# Patient Record
Sex: Male | Born: 1940
Health system: Southern US, Community
[De-identification: ages and names within clinical notes are randomized; demographics above are authoritative.]

## PROBLEM LIST (undated history)

## (undated) DIAGNOSIS — J449 Chronic obstructive pulmonary disease, unspecified: Secondary | ICD-10-CM

## (undated) DIAGNOSIS — G039 Meningitis, unspecified: Secondary | ICD-10-CM

## (undated) DIAGNOSIS — H9190 Unspecified hearing loss, unspecified ear: Secondary | ICD-10-CM

## (undated) DIAGNOSIS — J189 Pneumonia, unspecified organism: Secondary | ICD-10-CM

## (undated) DIAGNOSIS — N429 Disorder of prostate, unspecified: Secondary | ICD-10-CM

## (undated) DIAGNOSIS — M1990 Unspecified osteoarthritis, unspecified site: Secondary | ICD-10-CM

## (undated) HISTORY — PX: NO PAST SURGERIES: SHX2092

## (undated) HISTORY — PX: FRACTURE SURGERY: SHX138

---

## 2014-06-28 ENCOUNTER — Encounter (HOSPITAL_BASED_OUTPATIENT_CLINIC_OR_DEPARTMENT_OTHER): Payer: Self-pay | Admitting: Emergency Medicine

## 2014-06-28 ENCOUNTER — Emergency Department (HOSPITAL_BASED_OUTPATIENT_CLINIC_OR_DEPARTMENT_OTHER): Payer: Medicare Other

## 2014-06-28 ENCOUNTER — Inpatient Hospital Stay (HOSPITAL_BASED_OUTPATIENT_CLINIC_OR_DEPARTMENT_OTHER)
Admission: EM | Admit: 2014-06-28 | Discharge: 2014-06-30 | DRG: 871 | Disposition: A | Discharge status: Home / Self Care | Payer: Medicare Other | Attending: Internal Medicine | Admitting: Internal Medicine

## 2014-06-28 DIAGNOSIS — N429 Disorder of prostate, unspecified: Secondary | ICD-10-CM | POA: Diagnosis present

## 2014-06-28 DIAGNOSIS — A419 Sepsis, unspecified organism: Secondary | ICD-10-CM | POA: Diagnosis present

## 2014-06-28 DIAGNOSIS — J441 Chronic obstructive pulmonary disease with (acute) exacerbation: Secondary | ICD-10-CM | POA: Diagnosis present

## 2014-06-28 DIAGNOSIS — H9190 Unspecified hearing loss, unspecified ear: Secondary | ICD-10-CM | POA: Diagnosis present

## 2014-06-28 DIAGNOSIS — Z79899 Other long term (current) drug therapy: Secondary | ICD-10-CM

## 2014-06-28 DIAGNOSIS — Z7982 Long term (current) use of aspirin: Secondary | ICD-10-CM

## 2014-06-28 DIAGNOSIS — R05 Cough: Secondary | ICD-10-CM

## 2014-06-28 DIAGNOSIS — J189 Pneumonia, unspecified organism: Secondary | ICD-10-CM | POA: Diagnosis present

## 2014-06-28 DIAGNOSIS — R059 Cough, unspecified: Secondary | ICD-10-CM

## 2014-06-28 DIAGNOSIS — J9601 Acute respiratory failure with hypoxia: Secondary | ICD-10-CM | POA: Diagnosis present

## 2014-06-28 DIAGNOSIS — R0902 Hypoxemia: Secondary | ICD-10-CM

## 2014-06-28 DIAGNOSIS — Z87891 Personal history of nicotine dependence: Secondary | ICD-10-CM

## 2014-06-28 HISTORY — DX: Disorder of prostate, unspecified: N42.9

## 2014-06-28 HISTORY — DX: Meningitis, unspecified: G03.9

## 2014-06-28 HISTORY — DX: Unspecified hearing loss, unspecified ear: H91.90

## 2014-06-28 LAB — URINE MICROSCOPIC-ADD ON

## 2014-06-28 LAB — CBC WITH DIFFERENTIAL/PLATELET
BASOS ABS: 0 10*3/uL (ref 0.0–0.1)
BASOS PCT: 0 % (ref 0–1)
Eosinophils Absolute: 0.2 10*3/uL (ref 0.0–0.7)
Eosinophils Relative: 2 % (ref 0–5)
HCT: 41.6 % (ref 39.0–52.0)
Hemoglobin: 13.9 g/dL (ref 13.0–17.0)
Lymphocytes Relative: 10 % — ABNORMAL LOW (ref 12–46)
Lymphs Abs: 1 10*3/uL (ref 0.7–4.0)
MCH: 30.1 pg (ref 26.0–34.0)
MCHC: 33.4 g/dL (ref 30.0–36.0)
MCV: 90 fL (ref 78.0–100.0)
Monocytes Absolute: 1 10*3/uL (ref 0.1–1.0)
Monocytes Relative: 10 % (ref 3–12)
Neutro Abs: 7.4 10*3/uL (ref 1.7–7.7)
Neutrophils Relative %: 78 % — ABNORMAL HIGH (ref 43–77)
Platelets: 183 10*3/uL (ref 150–400)
RBC: 4.62 MIL/uL (ref 4.22–5.81)
RDW: 13.3 % (ref 11.5–15.5)
WBC: 9.6 10*3/uL (ref 4.0–10.5)

## 2014-06-28 LAB — BASIC METABOLIC PANEL
Anion gap: 15 (ref 5–15)
BUN: 12 mg/dL (ref 6–23)
CALCIUM: 9.2 mg/dL (ref 8.4–10.5)
CO2: 27 mEq/L (ref 19–32)
Chloride: 98 mEq/L (ref 96–112)
Creatinine, Ser: 1.1 mg/dL (ref 0.50–1.35)
GFR calc Af Amer: 76 mL/min — ABNORMAL LOW (ref >90)
GFR calc non Af Amer: 66 mL/min — ABNORMAL LOW (ref >90)
GLUCOSE: 161 mg/dL — AB (ref 70–99)
Potassium: 3.8 mEq/L (ref 3.7–5.3)
SODIUM: 140 meq/L (ref 137–147)

## 2014-06-28 LAB — URINALYSIS, ROUTINE W REFLEX MICROSCOPIC
BILIRUBIN URINE: NEGATIVE
Glucose, UA: NEGATIVE mg/dL
Ketones, ur: NEGATIVE mg/dL
Leukocytes, UA: NEGATIVE
Nitrite: NEGATIVE
PH: 5.5 (ref 5.0–8.0)
Protein, ur: 30 mg/dL — AB
SPECIFIC GRAVITY, URINE: 1.027 (ref 1.005–1.030)
UROBILINOGEN UA: 0.2 mg/dL (ref 0.0–1.0)

## 2014-06-28 LAB — EXPECTORATED SPUTUM ASSESSMENT W REFEX TO RESP CULTURE

## 2014-06-28 LAB — EXPECTORATED SPUTUM ASSESSMENT W GRAM STAIN, RFLX TO RESP C

## 2014-06-28 LAB — PRO B NATRIURETIC PEPTIDE: Pro B Natriuretic peptide (BNP): 44.2 pg/mL (ref 0–125)

## 2014-06-28 LAB — I-STAT CG4 LACTIC ACID, ED: Lactic Acid, Venous: 0.87 mmol/L (ref 0.5–2.2)

## 2014-06-28 LAB — MRSA PCR SCREENING: MRSA BY PCR: NEGATIVE

## 2014-06-28 LAB — TROPONIN I

## 2014-06-28 LAB — STREP PNEUMONIAE URINARY ANTIGEN: Strep Pneumo Urinary Antigen: NEGATIVE

## 2014-06-28 LAB — HIV ANTIBODY (ROUTINE TESTING W REFLEX): HIV 1&2 Ab, 4th Generation: NONREACTIVE

## 2014-06-28 MED ORDER — INFLUENZA VAC SPLIT QUAD 0.5 ML IM SUSY
0.5000 mL | PREFILLED_SYRINGE | INTRAMUSCULAR | Status: AC
  Administered 2014-06-29: 0.5 mL via INTRAMUSCULAR
  Filled 2014-06-28: qty 0.5

## 2014-06-28 MED ORDER — VANCOMYCIN HCL IN DEXTROSE 1-5 GM/200ML-% IV SOLN
1000.0000 mg | Freq: Once | INTRAVENOUS | Status: AC
  Administered 2014-06-28: 1000 mg via INTRAVENOUS
  Filled 2014-06-28: qty 200

## 2014-06-28 MED ORDER — SODIUM CHLORIDE 0.9 % IV BOLUS (SEPSIS)
500.0000 mL | Freq: Once | INTRAVENOUS | Status: AC
  Administered 2014-06-28: 500 mL via INTRAVENOUS

## 2014-06-28 MED ORDER — IPRATROPIUM-ALBUTEROL 0.5-2.5 (3) MG/3ML IN SOLN
3.0000 mL | Freq: Four times a day (QID) | RESPIRATORY_TRACT | Status: DC
  Administered 2014-06-28 – 2014-06-29 (×4): 3 mL via RESPIRATORY_TRACT
  Filled 2014-06-28 (×4): qty 3

## 2014-06-28 MED ORDER — SODIUM CHLORIDE 0.9 % IV SOLN
INTRAVENOUS | Status: DC
  Administered 2014-06-28: 125 mL/h via INTRAVENOUS
  Administered 2014-06-28: 10 mL/h via INTRAVENOUS

## 2014-06-28 MED ORDER — PIPERACILLIN-TAZOBACTAM 3.375 G IVPB 30 MIN
3.3750 g | Freq: Once | INTRAVENOUS | Status: AC
  Administered 2014-06-28: 3.375 g via INTRAVENOUS
  Filled 2014-06-28 (×2): qty 50

## 2014-06-28 MED ORDER — ACETAMINOPHEN 500 MG PO TABS
1000.0000 mg | ORAL_TABLET | Freq: Once | ORAL | Status: AC
  Administered 2014-06-28: 1000 mg via ORAL
  Filled 2014-06-28: qty 2

## 2014-06-28 MED ORDER — DEXTROSE 5 % IV SOLN
1.0000 g | INTRAVENOUS | Status: DC
  Administered 2014-06-28 – 2014-06-30 (×3): 1 g via INTRAVENOUS
  Filled 2014-06-28 (×4): qty 10

## 2014-06-28 MED ORDER — ALBUTEROL SULFATE (2.5 MG/3ML) 0.083% IN NEBU
2.5000 mg | INHALATION_SOLUTION | RESPIRATORY_TRACT | Status: DC | PRN
  Filled 2014-06-28: qty 3

## 2014-06-28 MED ORDER — ACETAMINOPHEN 325 MG PO TABS
650.0000 mg | ORAL_TABLET | Freq: Four times a day (QID) | ORAL | Status: DC | PRN
  Administered 2014-06-28: 650 mg via ORAL
  Filled 2014-06-28: qty 2

## 2014-06-28 MED ORDER — GUAIFENESIN-DM 100-10 MG/5ML PO SYRP
5.0000 mL | ORAL_SOLUTION | ORAL | Status: DC | PRN
  Administered 2014-06-28 – 2014-06-30 (×2): 5 mL via ORAL
  Filled 2014-06-28 (×2): qty 5

## 2014-06-28 MED ORDER — ASPIRIN 81 MG PO CHEW
81.0000 mg | CHEWABLE_TABLET | Freq: Every day | ORAL | Status: DC
  Administered 2014-06-28 – 2014-06-30 (×3): 81 mg via ORAL
  Filled 2014-06-28 (×3): qty 1

## 2014-06-28 MED ORDER — HEPARIN SODIUM (PORCINE) 5000 UNIT/ML IJ SOLN
5000.0000 [IU] | Freq: Three times a day (TID) | INTRAMUSCULAR | Status: DC
  Administered 2014-06-28 – 2014-06-30 (×7): 5000 [IU] via SUBCUTANEOUS
  Filled 2014-06-28 (×10): qty 1

## 2014-06-28 MED ORDER — ALBUTEROL SULFATE (2.5 MG/3ML) 0.083% IN NEBU
5.0000 mg | INHALATION_SOLUTION | Freq: Once | RESPIRATORY_TRACT | Status: AC
  Administered 2014-06-28: 5 mg via RESPIRATORY_TRACT
  Filled 2014-06-28: qty 6

## 2014-06-28 MED ORDER — IPRATROPIUM BROMIDE 0.02 % IN SOLN
0.5000 mg | Freq: Once | RESPIRATORY_TRACT | Status: AC
  Administered 2014-06-28: 0.5 mg via RESPIRATORY_TRACT
  Filled 2014-06-28: qty 2.5

## 2014-06-28 MED ORDER — KETOROLAC TROMETHAMINE 30 MG/ML IJ SOLN
30.0000 mg | Freq: Once | INTRAMUSCULAR | Status: AC
  Administered 2014-06-28: 30 mg via INTRAVENOUS
  Filled 2014-06-28: qty 1

## 2014-06-28 MED ORDER — AZITHROMYCIN 500 MG IV SOLR
500.0000 mg | INTRAVENOUS | Status: DC
  Administered 2014-06-28 – 2014-06-30 (×3): 500 mg via INTRAVENOUS
  Filled 2014-06-28 (×4): qty 500

## 2014-06-28 MED ORDER — TRAMADOL HCL 50 MG PO TABS
50.0000 mg | ORAL_TABLET | Freq: Four times a day (QID) | ORAL | Status: DC | PRN
  Administered 2014-06-28: 50 mg via ORAL
  Filled 2014-06-28: qty 1

## 2014-06-28 MED ORDER — TAMSULOSIN HCL 0.4 MG PO CAPS
0.8000 mg | ORAL_CAPSULE | Freq: Every day | ORAL | Status: DC
  Administered 2014-06-28: 0.8 mg via ORAL
  Filled 2014-06-28 (×3): qty 2

## 2014-06-28 MED ORDER — METHYLPREDNISOLONE SODIUM SUCC 40 MG IJ SOLR
40.0000 mg | Freq: Three times a day (TID) | INTRAMUSCULAR | Status: DC
  Administered 2014-06-28 – 2014-06-29 (×4): 40 mg via INTRAVENOUS
  Filled 2014-06-28 (×7): qty 1

## 2014-06-28 MED ORDER — GUAIFENESIN ER 600 MG PO TB12
600.0000 mg | ORAL_TABLET | Freq: Two times a day (BID) | ORAL | Status: DC
  Administered 2014-06-28 – 2014-06-29 (×3): 600 mg via ORAL
  Filled 2014-06-28 (×4): qty 1

## 2014-06-28 NOTE — Progress Notes (Signed)
PATIENT DETAILS Name: Jeffrey [name] Age: 73 y.o. Sex: male Date of Birth: May 11, 1941 Admit Date: 06/28/2014 Admitting Physician Ron ParkerHarvette [name] Jenkins, MD ZOX:[id number]:KALISH, Nolon BussingMICHAEL J, MD  Brief Summary: Jeffrey [name] is a 73 y.o. male ex-smoker presented to the ED with 1 week hx of cough, subjective fever, and SOB. In the ED he was noted to be tachycardic to the 120s which improved with IVF, and hypoxic to 88 on room air which improved with O2 via [place].CXR suggestive of PNA.  Subjective: Feels better  Assessment/Plan: Principal Problem:   CAP (community acquired pneumonia):Clinically improved, still febrile, no leukocytosis. Continue with IV Abx Active Problems:   Sepsis:secondary to above. Follow blood cultures. Continue IV Abx   Acute Hypoxic Resp Failure:secondary to CAP and likely underlying undiagnosed COPD   Suspected COPD exacerbation:40 year hx of smoking, quit 15 years back, wheezing with prolonged expiration. Add steroids, scheduled nebs. Currently comfortable,will need outpatient PFT's  Disposition: Remain inpatient  Antibiotics:  IV Rocephin 10/29>>  IV Zithromax 10/29>>  DVT Prophylaxis: Prophylactic Heparin   Code Status: Full code   Family Communication Son at bedside  Procedures:  None  CONSULTS:  None  Time spent 40 minutes-which includes 50% of the time with face-to-face with patient/ family and coordinating care related to the above assessment and plan.  MEDICATIONS: Scheduled Meds: . aspirin  81 mg Oral Daily  . azithromycin  500 mg Intravenous Q24H  . cefTRIAXone (ROCEPHIN)  IV  1 g Intravenous Q24H  . guaiFENesin  600 mg Oral BID  . heparin  5,000 Units Subcutaneous 3 times per day  . [START ON 06/29/2014] Influenza vac split quadrivalent PF  0.5 mL Intramuscular Tomorrow-1000  . ipratropium-albuterol  3 mL Nebulization Q6H  . methylPREDNISolone (SOLU-MEDROL) injection  40 mg Intravenous 3 times per day  . tamsulosin  0.8 mg Oral QPC  breakfast   Continuous Infusions: . sodium chloride 125 mL/hr (06/28/14 0859)   PRN Meds:.acetaminophen, albuterol, guaiFENesin-dextromethorphan  Antibiotics: Anti-infectives   Start     Dose/Rate Route Frequency Ordered Stop   06/28/14 0800  azithromycin (ZITHROMAX) 500 mg in dextrose 5 % 250 mL IVPB     500 mg 250 mL/hr over 60 Minutes Intravenous Every 24 hours 06/28/14 0557 07/05/14 0759   06/28/14 0600  cefTRIAXone (ROCEPHIN) 1 g in dextrose 5 % 50 mL IVPB     1 g 100 mL/hr over 30 Minutes Intravenous Every 24 hours 06/28/14 0557 07/05/14 0559   06/28/14 0300  vancomycin (VANCOCIN) IVPB 1000 mg/200 mL premix     1,000 mg 200 mL/hr over 60 Minutes Intravenous  Once 06/28/14 0245 06/28/14 0540   06/28/14 0300  piperacillin-tazobactam (ZOSYN) IVPB 3.375 g     3.375 g 100 mL/hr over 30 Minutes Intravenous  Once 06/28/14 0245 06/28/14 0335       PHYSICAL EXAM: Vital signs in last 24 hours: Filed Vitals:   06/28/14 0700 06/28/14 0740 06/28/14 1135 06/28/14 1200  BP:  125/68 137/75   Pulse:  80 91   Temp: 98.2 F (36.8 [name])   101.3 F (38.5 [name])  TempSrc: Oral   Tympanic  Resp:  14 18   Height:      Weight:      SpO2:  92% 96%     Weight change:  Filed Weights   06/28/14 0311 06/28/14 0530  Weight: 108.863 kg (240 lb) 112.7 kg (248 lb 7.3 oz)   Body mass index is  35.65 kg/(m^2).   Gen Exam: Awake and alert with clear speech.  Not in any resp distress Neck: Supple, No JVD.   Chest: Good air entry, prolonged exp, rhonchi all over CVS: S1 S2 Regular, no murmurs.  Abdomen: soft, BS +, non tender, non distended.  Extremities: no edema, lower extremities warm to touch Neurologic: Non Focal.   Skin: No Rash.   Wounds: N/A.   Intake/Output from previous day:  Intake/Output Summary (Last 24 hours) at 06/28/14 1227 Last data filed at 06/28/14 0600  Gross per 24 hour  Intake  302.5 ml  Output    125 ml  Net  177.5 ml     LAB RESULTS: CBC  Recent Labs Lab  06/28/14 0240  WBC 9.6  HGB 13.9  HCT 41.6  PLT 183  MCV 90.0  MCH 30.1  MCHC 33.4  RDW 13.3  LYMPHSABS 1.0  MONOABS 1.0  EOSABS 0.2  BASOSABS 0.0    Chemistries   Recent Labs Lab 06/28/14 0240  NA 140  K 3.8  CL 98  CO2 27  GLUCOSE 161*  BUN 12  CREATININE 1.10  CALCIUM 9.2    CBG: No results found for this basename: GLUCAP,  in the last 168 hours  GFR Estimated Creatinine Clearance: 77.5 ml/min (by [name]-G formula based on Cr of 1.1).  Coagulation profile No results found for this basename: INR, PROTIME,  in the last 168 hours  Cardiac Enzymes  Recent Labs Lab 06/28/14 0240  TROPONINI <0.30    No components found with this basename: POCBNP,  No results found for this basename: DDIMER,  in the last 72 hours No results found for this basename: HGBA1C,  in the last 72 hours No results found for this basename: CHOL, HDL, LDLCALC, TRIG, CHOLHDL, LDLDIRECT,  in the last 72 hours No results found for this basename: TSH, T4TOTAL, FREET3, T3FREE, THYROIDAB,  in the last 72 hours No results found for this basename: VITAMINB12, FOLATE, FERRITIN, TIBC, IRON, RETICCTPCT,  in the last 72 hours No results found for this basename: LIPASE, AMYLASE,  in the last 72 hours  Urine Studies No results found for this basename: UACOL, UAPR, USPG, UPH, UTP, UGL, UKET, UBIL, UHGB, UNIT, UROB, ULEU, UEPI, UWBC, URBC, UBAC, CAST, CRYS, UCOM, BILUA,  in the last 72 hours  MICROBIOLOGY: Recent Results (from the past 240 hour(s))  MRSA PCR SCREENING     Status: None   Collection Time    06/28/14  6:24 AM      Result Value Ref Range Status   MRSA by PCR NEGATIVE  NEGATIVE Final   Comment:            The GeneXpert MRSA Assay (FDA     approved for NASAL specimens     only), is one component of a     comprehensive MRSA colonization     surveillance program. It is not     intended to diagnose MRSA     infection nor to guide or     monitor treatment for     MRSA infections.     RADIOLOGY STUDIES/RESULTS: Dg Chest 2 View  06/28/2014   CLINICAL DATA:  Cough and fever.  Shortness of breath.  EXAM: CHEST  2 VIEW  COMPARISON:  None.  FINDINGS: There is an amorphous opacity in the left mid chest, most consistent with pneumonia given the clinical circumstances. Diffuse interstitial coarsening. No edema, effusion, or pneumothorax. Normal heart size. Mild aortic tortuosity.  IMPRESSION: Left-sided  pneumonia. Recommend followup radiography 6 weeks after treatment to ensure clearing.   Electronically Signed   By: Tiburcio PeaJonathan  Watts M.D.   On: 06/28/2014 03:27    Jeoffrey MassedGHIMIRE,SHANKER, MD  [facility] Pager:[phone]  If 7PM-7AM, please contact night-coverage [email or web address] Password [email or web address] 06/28/2014, 12:27 PM   LOS: 0 days

## 2014-06-28 NOTE — ED Provider Notes (Signed)
CSN: [id number]     Arrival date & time 06/28/14  0219 History   First MD Initiated Contact with Patient 06/28/14 [id number]     Chief Complaint  Patient presents with  . Shortness of Breath     (Consider location/radiation/quality/duration/timing/severity/associated sxs/prior Treatment) Patient is a 73 y.o. male presenting with shortness of breath. The history is provided by the patient.  Shortness of Breath Severity:  Severe Onset quality:  Gradual Duration:  1 week Timing:  Constant Progression:  Worsening Chronicity:  New Context: URI   Relieved by:  Nothing Worsened by:  Nothing tried Ineffective treatments:  None tried Associated symptoms: fever, sputum production and wheezing   Wheezing:    Severity:  Moderate   Onset quality:  Gradual   Timing:  Constant   Progression:  Worsening   Chronicity:  New Risk factors: no hx of PE/DVT     Past Medical History  Diagnosis Date  . Hard of hearing   . Prostate disorder   . Meningitis    History reviewed. No pertinent past surgical history. History reviewed. No pertinent family history. History  Substance Use Topics  . Smoking status: Former Games developermoker  . Smokeless tobacco: Not on file  . Alcohol Use: No    Review of Systems  Constitutional: Positive for fever.  Respiratory: Positive for sputum production, shortness of breath and wheezing.   All other systems reviewed and are negative.     Allergies  Review of patient's allergies indicates no known allergies.  Home Medications   Prior to Admission medications   Medication Sig Start Date End Date Taking? Authorizing Provider  aspirin 81 MG tablet Take 81 mg by mouth daily.   Yes Historical Provider, MD  Red Yeast Rice 600 MG CAPS Take by mouth.   Yes Historical Provider, MD  tamsulosin (FLOMAX) 0.4 MG CAPS capsule Take 0.8 mg by mouth.   Yes Historical Provider, MD   BP 142/89  Pulse 120  Temp(Src) 100.5 F (38.1 C) (Oral)  Resp 30  SpO2 91% Physical Exam   Constitutional: He is oriented to person, place, and time. He appears well-developed and well-nourished.  HENT:  Head: Normocephalic and atraumatic.  Mouth/Throat: Oropharynx is clear and moist.  Eyes: Conjunctivae are normal. Pupils are equal, round, and reactive to light.  Neck: Normal range of motion. Neck supple. No tracheal deviation present.  Cardiovascular: Regular rhythm and intact distal pulses.  Tachycardia present.   Pulmonary/Chest: He has wheezes. He has rhonchi. He has rales.  Abdominal: Soft. Bowel sounds are normal. There is no tenderness. There is no rebound and no guarding.  Musculoskeletal: Normal range of motion. He exhibits no edema.  Neurological: He is alert and oriented to person, place, and time. He has normal reflexes.  Skin: Skin is warm and dry. He is not diaphoretic.  Psychiatric: He has a normal mood and affect.    ED Course  Procedures (including critical care time) Labs Review Labs Reviewed  CULTURE, BLOOD (ROUTINE X 2)  CULTURE, BLOOD (ROUTINE X 2)  CBC WITH DIFFERENTIAL  BASIC METABOLIC PANEL  TROPONIN I  PRO B NATRIURETIC PEPTIDE  I-STAT CG4 LACTIC ACID, ED    Imaging Review No results found.   EKG Interpretation   Date/Time:  Thursday June 28 2014 02:41:41 EDT Ventricular Rate:  113 PR Interval:  152 QRS Duration: 96 QT Interval:  320 QTC Calculation: 438 R Axis:   78 Text Interpretation:  Sinus tachycardia Confirmed by [facility]  MD,  Mylo Choi ([id number])  on 06/28/2014 2:42:05 AM      MDM   Final diagnoses:  Cough    MDM Reviewed: nursing note and vitals Interpretation: labs, ECG and x-ray (pneumonia by me no elevated WBC count negative troponin) Consults: admitting MD   Medications  vancomycin (VANCOCIN) IVPB 1000 mg/200 mL premix (1,000 mg Intravenous New Bag/Given 06/28/14 0305)  0.9 %  sodium chloride infusion (not administered)  piperacillin-tazobactam (ZOSYN) IVPB 3.375 g (0 g Intravenous Stopped 06/28/14 0335)   albuterol (PROVENTIL) (2.5 MG/3ML) 0.083% nebulizer solution 5 mg (5 mg Nebulization Given 06/28/14 0315)  ipratropium (ATROVENT) nebulizer solution 0.5 mg (0.5 mg Nebulization Given 06/28/14 0315)  ketorolac (TORADOL) 30 MG/ML injection 30 mg (30 mg Intravenous Given 06/28/14 0301)  sodium chloride 0.9 % bolus 500 mL (500 mLs Intravenous New Bag/Given 06/28/14 0341)  acetaminophen (TYLENOL) tablet 1,000 mg (1,000 mg Oral Given 06/28/14 0340)   Admit   Erich Kochan Smitty CordsK Kollin Udell-Rasch, MD 06/28/14 [phone]

## 2014-06-28 NOTE — H&P (Signed)
[facility] History and Physical  Jeffrey FrayRichard C Baugh ZOX:[id number] DOB: 12/09/1940 DOA: 06/28/2014  Referring physician: EDP PCP: Sid FalconKALISH, MICHAEL J, MD   Chief Complaint: SOB   HPI: Jeffrey [name] is a 73 y.o. male who [facility] with 1 week history of not feeling well with URI symptoms.  He developed cough over the past few days and severe SOB onset tonight which prompted him to come to the ED.  Symptoms are associated with fever (objective to 100.8 in the ED).  Cough is productive.  There is associated wheezing.  In the ED he was noted to be tachycardic to the 120s which improved with IVF, and hypoxic to 88 on room air which improved with O2 via [place].  He has no history of COPD, and is not on home oxygen.  Review of Systems: Systems reviewed.  As above, otherwise negative  Past Medical History  Diagnosis Date  . Hard of hearing   . Prostate disorder   . Meningitis     2001   History reviewed. No pertinent past surgical history. Social History:  reports that he has quit smoking. His smoking use included Cigarettes. He has a 66 pack-year smoking history. He has never used smokeless tobacco. He reports that he does not drink alcohol or use illicit drugs.  No Known Allergies  History reviewed. No pertinent family history.   Prior to Admission medications   Medication Sig Start Date End Date Taking? Authorizing Provider  aspirin 81 MG tablet Take 81 mg by mouth daily.   Yes Historical Provider, MD  Red Yeast Rice 600 MG CAPS Take by mouth.   Yes Historical Provider, MD  tamsulosin (FLOMAX) 0.4 MG CAPS capsule Take 0.8 mg by mouth.   Yes Historical Provider, MD   Physical Exam: Filed Vitals:   06/28/14 0530  BP: 128/68  Pulse: 95  Temp: 98.3 F (36.8 C)  Resp: 16    BP 128/68  Pulse 95  Temp(Src) 98.3 F (36.8 C) (Oral)  Resp 16  Ht 5\' 10"  (1.778 m)  Wt 112.7 kg (248 lb 7.3 oz)  BMI 35.65 kg/m2  SpO2 93%  General Appearance:    Alert, oriented, no  distress, appears stated age  Head:    Normocephalic, atraumatic  Eyes:    PERRL, EOMI, sclera non-icteric        Nose:   Nares without drainage or epistaxis. Mucosa, turbinates normal  Throat:   Moist mucous membranes. Oropharynx without erythema or exudate.  Neck:   Supple. No carotid bruits.  No thyromegaly.  No lymphadenopathy.   Back:     No CVA tenderness, no spinal tenderness  Lungs:     Coarse breath sounds on left.  Chest wall:    No tenderness to palpitation  Heart:    Regular rate and rhythm without murmurs, gallops, rubs  Abdomen:     Soft, non-tender, nondistended, normal bowel sounds, no organomegaly  Genitalia:    deferred  Rectal:    deferred  Extremities:   No clubbing, cyanosis or edema.  Pulses:   2+ and symmetric all extremities  Skin:   Skin color, texture, turgor normal, no rashes or lesions  Lymph nodes:   Cervical, supraclavicular, and axillary nodes normal  Neurologic:   CNII-XII intact. Normal strength, sensation and reflexes      throughout    Labs on Admission:  Basic Metabolic Panel:  Recent Labs Lab 06/28/14 0240  NA 140  K 3.8  CL 98  CO2 27  GLUCOSE 161*  BUN 12  CREATININE 1.10  CALCIUM 9.2   Liver Function Tests: No results found for this basename: AST, ALT, ALKPHOS, BILITOT, PROT, ALBUMIN,  in the last 168 hours No results found for this basename: LIPASE, AMYLASE,  in the last 168 hours No results found for this basename: AMMONIA,  in the last 168 hours CBC:  Recent Labs Lab 06/28/14 0240  WBC 9.6  NEUTROABS 7.4  HGB 13.9  HCT 41.6  MCV 90.0  PLT 183   Cardiac Enzymes:  Recent Labs Lab 06/28/14 0240  TROPONINI <0.30    BNP (last 3 results)  Recent Labs  06/28/14 0240  PROBNP 44.2   CBG: No results found for this basename: GLUCAP,  in the last 168 hours  Radiological Exams on Admission: Dg Chest 2 View  06/28/2014   CLINICAL DATA:  Cough and fever.  Shortness of breath.  EXAM: CHEST  2 VIEW  COMPARISON:  None.   FINDINGS: There is an amorphous opacity in the left mid chest, most consistent with pneumonia given the clinical circumstances. Diffuse interstitial coarsening. No edema, effusion, or pneumothorax. Normal heart size. Mild aortic tortuosity.  IMPRESSION: Left-sided pneumonia. Recommend followup radiography 6 weeks after treatment to ensure clearing.   Electronically Signed   By: Tiburcio PeaJonathan  Watts M.D.   On: 06/28/2014 03:27    EKG: Independently reviewed.  Assessment/Plan Principal Problem:   CAP (community acquired pneumonia) Active Problems:   Sepsis   Hypoxia   1. CAP and resulting mild sepsis - patient with left sided PNA as demonstrated on CXR 1. Tylenol prn fever 2. Got zosyn and vanc in ED, will switch him to rocephin and azithromycin for CAP coverage 3. Tele monitor for tachycardia which has now resolved.  Suspect tachycardia was related to fever 4. IVF 2. Hypoxia - improved with O2 via [place], continuous pulse ox, continue to wean O2 as able, home when no longer requiring O2.   Code Status: Full Code  Family Communication: No family in room Disposition Plan: Admit to inpatient   Time spent: 70 min  Isaiah Torok M. [facility] Pager [phone]  If 7AM-7PM, please contact the day team taking care of the patient [email or web address] Password [email or web address] 06/28/2014, 5:57 AM

## 2014-06-28 NOTE — Progress Notes (Signed)
Utilization review completed.  

## 2014-06-28 NOTE — ED Notes (Signed)
"  Fighting a cold" x1 week.  Productive cough.  Tonight he states he is having difficulty breathing.

## 2014-06-28 NOTE — ED Notes (Signed)
Patient transported to X-ray 

## 2014-06-29 DIAGNOSIS — A419 Sepsis, unspecified organism: Secondary | ICD-10-CM | POA: Diagnosis not present

## 2014-06-29 LAB — LEGIONELLA ANTIGEN, URINE

## 2014-06-29 MED ORDER — PREDNISONE 10 MG PO TABS
60.0000 mg | ORAL_TABLET | Freq: Every day | ORAL | Status: DC
  Administered 2014-06-30: 60 mg via ORAL
  Filled 2014-06-29 (×2): qty 1

## 2014-06-29 MED ORDER — GUAIFENESIN ER 600 MG PO TB12
1200.0000 mg | ORAL_TABLET | Freq: Two times a day (BID) | ORAL | Status: DC
  Administered 2014-06-29 – 2014-06-30 (×2): 1200 mg via ORAL
  Filled 2014-06-29 (×3): qty 2

## 2014-06-29 MED ORDER — TAMSULOSIN HCL 0.4 MG PO CAPS
0.8000 mg | ORAL_CAPSULE | Freq: Every day | ORAL | Status: DC
  Administered 2014-06-29: 0.8 mg via ORAL
  Filled 2014-06-29 (×2): qty 2

## 2014-06-29 MED ORDER — IPRATROPIUM-ALBUTEROL 0.5-2.5 (3) MG/3ML IN SOLN
3.0000 mL | RESPIRATORY_TRACT | Status: DC | PRN
  Administered 2014-06-29: 3 mL via RESPIRATORY_TRACT
  Filled 2014-06-29: qty 3

## 2014-06-29 NOTE — Progress Notes (Signed)
PATIENT DETAILS Name: Jeffrey [name] Age: 73 y.o. Sex: male Date of Birth: 18-Sep-1940 Admit Date: 06/28/2014 Admitting Physician Ron Parker, MD ZOX:[id number], Nolon Bussing, MD  Brief Summary: Jeffrey [name] is a 73 y.o. male ex-smoker presented to the ED with 1 week hx of cough, subjective fever, and SOB. In the ED he was noted to be tachycardic to the 120s which improved with IVF, and hypoxic to 88 on room air which improved with O2 via [place].CXR suggestive of PNA.  Subjective: Seen with son at bedside, feels much better, denies fever and significant shortness of breath.  Assessment/Plan:  Principal Problem:   CAP (community acquired pneumonia) Active Problems:   Sepsis   Hypoxia   CAP (community acquired pneumonia): Presented with subjective fever, shortness of breath and hypoxia of 88%. Last fever of 101.3 on 10/29 at 11 AM, no fever since then. Patient is on Rocephin and azithromycin, continue IV antibiotics as well as mucolytics, bronchodilators and antitussives.  Sepsis:secondary to above. Follow blood cultures. Continue IV Abx  Acute Hypoxic Resp Failure: Presented with oxygen saturation of 88% on room air, improved on supplemental oxygen via [place]  Secondary to CAP and likely underlying undiagnosed COPD  Suspected COPD exacerbation: 40 year hx of smoking, quit 15 years back, wheezing with prolonged expiration. Add steroids, scheduled nebs.  Currently comfortable he will likely benefit from outpatient PFT's  Disposition: Remain inpatient  Antibiotics:  IV Rocephin 10/29>>  IV Zithromax 10/29>>  DVT Prophylaxis: Prophylactic Heparin   Code Status: Full code   Family Communication Son at bedside  Procedures:  None  CONSULTS:  None  Time spent 40 minutes-which includes 50% of the time with face-to-face with patient/ family and coordinating care related to the above assessment and plan.  MEDICATIONS: Scheduled Meds: . aspirin  81 mg Oral  Daily  . azithromycin  500 mg Intravenous Q24H  . cefTRIAXone (ROCEPHIN)  IV  1 g Intravenous Q24H  . guaiFENesin  600 mg Oral BID  . heparin  5,000 Units Subcutaneous 3 times per day  . Influenza vac split quadrivalent PF  0.5 mL Intramuscular Tomorrow-1000  . methylPREDNISolone (SOLU-MEDROL) injection  40 mg Intravenous 3 times per day  . tamsulosin  0.8 mg Oral QPC supper   Continuous Infusions: . sodium chloride 10 mL/hr at 06/28/14 2000   PRN Meds:.acetaminophen, guaiFENesin-dextromethorphan, ipratropium-albuterol, traMADol  Antibiotics: Anti-infectives   Start     Dose/Rate Route Frequency Ordered Stop   06/28/14 0800  azithromycin (ZITHROMAX) 500 mg in dextrose 5 % 250 mL IVPB     500 mg 250 mL/hr over 60 Minutes Intravenous Every 24 hours 06/28/14 0557 07/05/14 0759   06/28/14 0600  cefTRIAXone (ROCEPHIN) 1 g in dextrose 5 % 50 mL IVPB     1 g 100 mL/hr over 30 Minutes Intravenous Every 24 hours 06/28/14 0557 07/05/14 0559   06/28/14 0300  vancomycin (VANCOCIN) IVPB 1000 mg/200 mL premix     1,000 mg 200 mL/hr over 60 Minutes Intravenous  Once 06/28/14 0245 06/28/14 0540   06/28/14 0300  piperacillin-tazobactam (ZOSYN) IVPB 3.375 g     3.375 g 100 mL/hr over 30 Minutes Intravenous  Once 06/28/14 0245 06/28/14 0335       PHYSICAL EXAM: Vital signs in last 24 hours: Filed Vitals:   06/28/14 2046 06/28/14 2250 06/29/14 0100 06/29/14 0638  BP:  130/66  129/76  Pulse:  93 94 91  Temp:  97.6 F (36.4  C)  98 F (36.7 C)  TempSrc:  Oral  Oral  Resp:  19 18 18   Height:  5\' 10"  (1.778 m)    Weight:  113.9 kg (251 lb 1.7 oz)    SpO2: 93% 92% 96% 95%    Weight change: 5.037 kg (11 lb 1.7 oz) Filed Weights   06/28/14 0311 06/28/14 0530 06/28/14 2250  Weight: 108.863 kg (240 lb) 112.7 kg (248 lb 7.3 oz) 113.9 kg (251 lb 1.7 oz)   Body mass index is 36.03 kg/(m^2).   Gen Exam: Awake and alert with clear speech.  Not in any resp distress Neck: Supple, No JVD.     Chest: Good air entry, prolonged exp, rhonchi all over CVS: S1 S2 Regular, no murmurs.  Abdomen: soft, BS +, non tender, non distended.  Extremities: no edema, lower extremities warm to touch Neurologic: Non Focal.   Skin: No Rash.   Wounds: N/A.   Intake/Output from previous day:  Intake/Output Summary (Last 24 hours) at 06/29/14 1305 Last data filed at 06/29/14 0900  Gross per 24 hour  Intake 1805.84 ml  Output   1450 ml  Net 355.84 ml     LAB RESULTS: CBC  Recent Labs Lab 06/28/14 0240  WBC 9.6  HGB 13.9  HCT 41.6  PLT 183  MCV 90.0  MCH 30.1  MCHC 33.4  RDW 13.3  LYMPHSABS 1.0  MONOABS 1.0  EOSABS 0.2  BASOSABS 0.0    Chemistries   Recent Labs Lab 06/28/14 0240  NA 140  K 3.8  CL 98  CO2 27  GLUCOSE 161*  BUN 12  CREATININE 1.10  CALCIUM 9.2    CBG: No results found for this basename: GLUCAP,  in the last 168 hours  GFR Estimated Creatinine Clearance: 77.9 ml/min (by C-G formula based on Cr of 1.1).  Coagulation profile No results found for this basename: INR, PROTIME,  in the last 168 hours  Cardiac Enzymes  Recent Labs Lab 06/28/14 0240  TROPONINI <0.30    No components found with this basename: POCBNP,  No results found for this basename: DDIMER,  in the last 72 hours No results found for this basename: HGBA1C,  in the last 72 hours No results found for this basename: CHOL, HDL, LDLCALC, TRIG, CHOLHDL, LDLDIRECT,  in the last 72 hours No results found for this basename: TSH, T4TOTAL, FREET3, T3FREE, THYROIDAB,  in the last 72 hours No results found for this basename: VITAMINB12, FOLATE, FERRITIN, TIBC, IRON, RETICCTPCT,  in the last 72 hours No results found for this basename: LIPASE, AMYLASE,  in the last 72 hours  Urine Studies No results found for this basename: UACOL, UAPR, USPG, UPH, UTP, UGL, UKET, UBIL, UHGB, UNIT, UROB, ULEU, UEPI, UWBC, URBC, UBAC, CAST, CRYS, UCOM, BILUA,  in the last 72 hours  MICROBIOLOGY: Recent  Results (from the past 240 hour(s))  CULTURE, BLOOD (ROUTINE X 2)     Status: None   Collection Time    06/28/14  2:40 AM      Result Value Ref Range Status   Specimen Description BLOOD LEFT ANTECUBITAL   Final   Special Requests     Final   Value: BOTTLES DRAWN AEROBIC AND ANAEROBIC AER 6cc ANA 5cc   Culture  Setup Time     Final   Value: 06/28/2014 10:39     Performed at [facility]   Culture     Final   Value:  BLOOD CULTURE RECEIVED NO GROWTH TO DATE CULTURE WILL BE HELD FOR 5 DAYS BEFORE ISSUING A FINAL NEGATIVE REPORT     [facility]   Report Status PENDING   Incomplete  CULTURE, BLOOD (ROUTINE X 2)     Status: None   Collection Time    06/28/14  2:45 AM      Result Value Ref Range Status   Specimen Description BLOOD RIGHT ANTECUBITAL   Final   Special Requests     Final   Value: BOTTLES DRAWN AEROBIC AND ANAEROBIC AER 5cc ANA 5cc   Culture  Setup Time     Final   Value: 06/28/2014 10:38     Performed at [facility]   Culture     Final   Value:        BLOOD CULTURE RECEIVED NO GROWTH TO DATE CULTURE WILL BE HELD FOR 5 DAYS BEFORE ISSUING A FINAL NEGATIVE REPORT     Performed at [facility]   Report Status PENDING   Incomplete  MRSA PCR SCREENING     Status: None   Collection Time    06/28/14  6:24 AM      Result Value Ref Range Status   MRSA by PCR NEGATIVE  NEGATIVE Final   Comment:            The GeneXpert MRSA Assay (FDA     approved for NASAL specimens     only), is one component of a     comprehensive MRSA colonization     surveillance program. It is not     intended to diagnose MRSA     infection nor to guide or     monitor treatment for     MRSA infections.  CULTURE, EXPECTORATED SPUTUM-ASSESSMENT     Status: None   Collection Time    06/28/14  3:43 PM      Result Value Ref Range Status   Specimen Description SPU   Final   Special Requests NONE   Final   Sputum evaluation     Final   Value: THIS  SPECIMEN IS ACCEPTABLE. RESPIRATORY CULTURE REPORT TO FOLLOW.   Report Status 06/28/2014 FINAL   Final  CULTURE, RESPIRATORY (NON-EXPECTORATED)     Status: None   Collection Time    06/28/14  3:43 PM      Result Value Ref Range Status   Specimen Description SPUTUM   Final   Special Requests NONE   Final   Gram Stain     Final   Value: ABUNDANT WBC PRESENT,BOTH PMN AND MONONUCLEAR     RARE SQUAMOUS EPITHELIAL CELLS PRESENT     RARE GRAM POSITIVE COCCI     IN PAIRS IN CLUSTERS     Performed at [facility]   Culture     Final   Value: NO GROWTH     Performed at [facility]   Report Status PENDING   Incomplete    RADIOLOGY STUDIES/RESULTS: Dg Chest 2 View  06/28/2014   CLINICAL DATA:  Cough and fever.  Shortness of breath.  EXAM: CHEST  2 VIEW  COMPARISON:  None.  FINDINGS: There is an amorphous opacity in the left mid chest, most consistent with pneumonia given the clinical circumstances. Diffuse interstitial coarsening. No edema, effusion, or pneumothorax. Normal heart size. Mild aortic tortuosity.  IMPRESSION: Left-sided pneumonia. Recommend followup radiography 6 weeks after treatment to ensure clearing.   Electronically Signed   By: Christiane HaJonathan  Watts M.D.   On: 06/28/2014 03:27    Damaso Laday A, MD  [facility] Pager:[phone]  If 7PM-7AM, please contact night-coverage [email or web address] Password [email or web address] 06/29/2014, 1:05 PM   LOS: 1 day

## 2014-06-29 NOTE — Care Management Note (Signed)
    Page 1 of 1   07/02/2014     8:08:48 AM CARE MANAGEMENT NOTE 07/02/2014  Patient:  Hulan FrayBARE,Carnel C   Account Number:  [id number]  Date Initiated:  06/29/2014  Documentation initiated by:  Letha CapeAYLOR,Kaislee Chao  Subjective/Objective Assessment:   dx sob, pna  admit- lives alone.     Action/Plan:   Anticipated DC Date:  06/30/2014   Anticipated DC Plan:  HOME/SELF CARE      DC Planning Services  CM consult      Choice offered to / List presented to:             Status of service:  Completed, signed off Medicare Important Message given?  NA - LOS <3 / Initial given by admissions (If response is "NO", the following Medicare IM given date fields will be blank) Date Medicare IM given:   Medicare IM given by:   Date Additional Medicare IM given:   Additional Medicare IM given by:    Discharge Disposition:  HOME/SELF CARE  Per UR Regulation:  Reviewed for med. necessity/level of care/duration of stay  If discussed at Long Length of Stay Meetings, dates discussed:    Comments:  06/29/14 1516 Letha Capeeborah Shakeena Kafer RN, BSN [phone] patient lives alone, NCM will continue to follow for dc needs.

## 2014-06-30 MED ORDER — PREDNISONE (PAK) 10 MG PO TABS: ORAL_TABLET | ORAL | Status: DC

## 2014-06-30 MED ORDER — HYDROCODONE-ACETAMINOPHEN 5-325 MG PO TABS: 1.0000 | ORAL_TABLET | Freq: Four times a day (QID) | ORAL | Status: DC | PRN

## 2014-06-30 MED ORDER — LEVOFLOXACIN 750 MG PO TABS: 750.0000 mg | ORAL_TABLET | Freq: Every day | ORAL | Status: DC

## 2014-06-30 MED ORDER — GUAIFENESIN ER 600 MG PO TB12: 1200.0000 mg | ORAL_TABLET | Freq: Two times a day (BID) | ORAL | Status: DC

## 2014-06-30 NOTE — Discharge Summary (Signed)
Physician Discharge Summary  Jeffrey [name] ZOX:[id number] DOB: 09-19-1940 DOA: 06/28/2014  PCP: Sid FalconKALISH, MICHAEL J, MD  Admit date: 06/28/2014 Discharge date: 06/30/2014  Time spent: 40 minutes  Recommendations for Outpatient Follow-up:  1. Follow-up with primary care physician within one week  Discharge Diagnoses:  Principal Problem:   CAP (community acquired pneumonia) Active Problems:   Sepsis   Hypoxia   Discharge Condition: Stable  Diet recommendation: Regular  Filed Weights   06/28/14 0311 06/28/14 0530 06/28/14 2250  Weight: 108.863 kg (240 lb) 112.7 kg (248 lb 7.3 oz) 113.9 kg (251 lb 1.7 oz)    History of present illness:  Jeffrey [name] is a 73 y.o. male who [facility] with 1 week history of not feeling well with URI symptoms. He developed cough over the past few days and severe SOB onset tonight which prompted him to come to the ED. Symptoms are associated with fever (objective to 100.8 in the ED). Cough is productive. There is associated wheezing.  In the ED he was noted to be tachycardic to the 120s which improved with IVF, and hypoxic to 88 on room air which improved with O2 via [place]. He has no history of COPD, and is not on home oxygen.  Hospital Course:   CAP (community acquired pneumonia):  Presented with subjective fever, shortness of breath and hypoxia of 88%.  Last fever of 101.3 on 10/29 at 11 AM, no fever since then.  Patient started initially on Rocephin and azithromycin, mucolytics, bronchodilators and antitussives.  Discharge on 5 more days of levofloxacin 750, Mucinex twice a day. Given prescription for Norco as he mentioned moderate to severe pleuritic chest pain.  Sepsis: Present at admission with fever of 101.3, heart rate of 120 and presence of pneumonia. Follow blood cultures. Continue IV Abx. This is resolved before discharge.  Acute Hypoxic Resp Failure:  Presented with oxygen saturation of 88% on room air, improved on  supplemental oxygen via [place]  Secondary to CAP and likely underlying undiagnosed COPD   Suspected COPD exacerbation:  40 year hx of smoking, quit 15 years back, wheezing with prolonged. Discharged on tapered dose of steroid. Patient follow-up with PCP to determine if PFTs needed as outpatient after he recovers from concurrent illness.   Procedures:  None  Consultations:  None  Discharge Exam: Filed Vitals:   06/30/14 0516  BP: 132/75  Pulse: 81  Temp: 98 F (36.7 C)  Resp: 18   General: Alert and awake, oriented x3, not in any acute distress. HEENT: anicteric sclera, pupils reactive to light and accommodation, EOMI CVS: S1-S2 clear, no murmur rubs or gallops Chest: clear to auscultation bilaterally, no wheezing, rales or rhonchi Abdomen: soft nontender, nondistended, normal bowel sounds, no organomegaly Extremities: no cyanosis, clubbing or edema noted bilaterally Neuro: Cranial nerves II-XII intact, no focal neurological deficits  Discharge Instructions You were cared for by a hospitalist during your hospital stay. If you have any questions about your discharge medications or the care you received while you were in the hospital after you are discharged, you can call the unit and asked to speak with the hospitalist on call if the hospitalist that took care of you is not available. Once you are discharged, your primary care physician will handle any further medical issues. Please note that NO REFILLS for any discharge medications will be authorized once you are discharged, as it is imperative that you return to your primary care physician (or establish a relationship  with a primary care physician if you do not have one) for your aftercare needs so that they can reassess your need for medications and monitor your lab values.  Discharge Instructions   Increase activity slowly    Complete by:  As directed           Current Discharge Medication List    START taking these  medications   Details  guaiFENesin (MUCINEX) 600 MG 12 hr tablet Take 2 tablets (1,200 mg total) by mouth 2 (two) times daily. Qty: 30 tablet, Refills: 0    HYDROcodone-acetaminophen (NORCO) 5-325 MG per tablet Take 1 tablet by mouth every 6 (six) hours as needed for moderate pain. Qty: 20 tablet, Refills: 0    levofloxacin (LEVAQUIN) 750 MG tablet Take 1 tablet (750 mg total) by mouth daily. Qty: 5 tablet, Refills: 0    predniSONE (STERAPRED UNI-PAK) 10 MG tablet Take 6-5-4-3-2-1 till gone Qty: 21 tablet, Refills: 0      CONTINUE these medications which have NOT CHANGED   Details  aspirin 81 MG tablet Take 81 mg by mouth daily.    Red Yeast Rice 600 MG CAPS Take 600 mg by mouth daily.     tamsulosin (FLOMAX) 0.4 MG CAPS capsule Take 0.8 mg by mouth daily after supper.        No Known Allergies Follow-up Information   Follow up with Sid FalconKALISH, MICHAEL J, MD In 1 week.   Specialty:  Family Medicine   Contact information:   [address] [place] [id number] [phone]        The results of significant diagnostics from this hospitalization (including imaging, microbiology, ancillary and laboratory) are listed below for reference.    Significant Diagnostic Studies: Dg Chest 2 View  06/28/2014   CLINICAL DATA:  Cough and fever.  Shortness of breath.  EXAM: CHEST  2 VIEW  COMPARISON:  None.  FINDINGS: There is an amorphous opacity in the left mid chest, most consistent with pneumonia given the clinical circumstances. Diffuse interstitial coarsening. No edema, effusion, or pneumothorax. Normal heart size. Mild aortic tortuosity.  IMPRESSION: Left-sided pneumonia. Recommend followup radiography 6 weeks after treatment to ensure clearing.   Electronically Signed   By: Tiburcio PeaJonathan  Watts M.D.   On: 06/28/2014 03:27    Microbiology: Recent Results (from the past 240 hour(s))  CULTURE, BLOOD (ROUTINE X 2)     Status: None   Collection Time    06/28/14  2:40 AM       Result Value Ref Range Status   Specimen Description BLOOD LEFT ANTECUBITAL   Final   Special Requests     Final   Value: BOTTLES DRAWN AEROBIC AND ANAEROBIC AER 6cc ANA 5cc   Culture  Setup Time     Final   Value: 06/28/2014 10:39     Performed at [facility]   Culture     Final   Value:        BLOOD CULTURE RECEIVED NO GROWTH TO DATE CULTURE WILL BE HELD FOR 5 DAYS BEFORE ISSUING A FINAL NEGATIVE REPORT     Performed at [facility]   Report Status PENDING   Incomplete  CULTURE, BLOOD (ROUTINE X 2)     Status: None   Collection Time    06/28/14  2:45 AM      Result Value Ref Range Status   Specimen Description BLOOD RIGHT ANTECUBITAL   Final   Special Requests     Final  Value: BOTTLES DRAWN AEROBIC AND ANAEROBIC AER 5cc ANA 5cc   Culture  Setup Time     Final   Value: 06/28/2014 10:38     Performed at [facility]   Culture     Final   Value:        BLOOD CULTURE RECEIVED NO GROWTH TO DATE CULTURE WILL BE HELD FOR 5 DAYS BEFORE ISSUING A FINAL NEGATIVE REPORT     Performed at [facility]   Report Status PENDING   Incomplete  MRSA PCR SCREENING     Status: None   Collection Time    06/28/14  6:24 AM      Result Value Ref Range Status   MRSA by PCR NEGATIVE  NEGATIVE Final   Comment:            The GeneXpert MRSA Assay (FDA     approved for NASAL specimens     only), is one component of a     comprehensive MRSA colonization     surveillance program. It is not     intended to diagnose MRSA     infection nor to guide or     monitor treatment for     MRSA infections.  CULTURE, EXPECTORATED SPUTUM-ASSESSMENT     Status: None   Collection Time    06/28/14  3:43 PM      Result Value Ref Range Status   Specimen Description SPU   Final   Special Requests NONE   Final   Sputum evaluation     Final   Value: THIS SPECIMEN IS ACCEPTABLE. RESPIRATORY CULTURE REPORT TO FOLLOW.   Report Status 06/28/2014 FINAL   Final  CULTURE, RESPIRATORY  (NON-EXPECTORATED)     Status: None   Collection Time    06/28/14  3:43 PM      Result Value Ref Range Status   Specimen Description SPUTUM   Final   Special Requests NONE   Final   Gram Stain     Final   Value: ABUNDANT WBC PRESENT,BOTH PMN AND MONONUCLEAR     RARE SQUAMOUS EPITHELIAL CELLS PRESENT     RARE GRAM POSITIVE COCCI     IN PAIRS IN CLUSTERS     Performed at [facility]   Culture     Final   Value: NO GROWTH     Performed at [facility]   Report Status PENDING   Incomplete     Labs: Basic Metabolic Panel:  Recent Labs Lab 06/28/14 0240  NA 140  K 3.8  CL 98  CO2 27  GLUCOSE 161*  BUN 12  CREATININE 1.10  CALCIUM 9.2   Liver Function Tests: No results found for this basename: AST, ALT, ALKPHOS, BILITOT, PROT, ALBUMIN,  in the last 168 hours No results found for this basename: LIPASE, AMYLASE,  in the last 168 hours No results found for this basename: AMMONIA,  in the last 168 hours CBC:  Recent Labs Lab 06/28/14 0240  WBC 9.6  NEUTROABS 7.4  HGB 13.9  HCT 41.6  MCV 90.0  PLT 183   Cardiac Enzymes:  Recent Labs Lab 06/28/14 0240  TROPONINI <0.30   BNP: BNP (last 3 results)  Recent Labs  06/28/14 0240  PROBNP 44.2   CBG: No results found for this basename: GLUCAP,  in the last 168 hours     Signed:  Danya Spearman A  [facility] 06/30/2014, 9:56 AM

## 2014-06-30 NOTE — Progress Notes (Signed)
Patient discharge teaching given, including activity, diet, follow-up appoints, and medications. Patient verbalized understanding of all discharge instructions. IV access was d/c'd. Vitals are stable. Skin is intact except as charted in most recent assessments. Pt to be escorted out by NT, to be driven home by family. 

## 2014-07-01 LAB — CULTURE, RESPIRATORY W GRAM STAIN: Culture: NORMAL

## 2014-07-04 LAB — CULTURE, BLOOD (ROUTINE X 2)
CULTURE: NO GROWTH
Culture: NO GROWTH

## 2018-02-05 ENCOUNTER — Other Ambulatory Visit: Payer: Self-pay

## 2018-02-05 ENCOUNTER — Encounter (HOSPITAL_BASED_OUTPATIENT_CLINIC_OR_DEPARTMENT_OTHER): Payer: Self-pay | Admitting: Emergency Medicine

## 2018-02-05 ENCOUNTER — Emergency Department (HOSPITAL_BASED_OUTPATIENT_CLINIC_OR_DEPARTMENT_OTHER)
Admission: EM | Admit: 2018-02-05 | Discharge: 2018-02-05 | Disposition: A | Discharge status: Home / Self Care | Payer: PPO | Attending: Emergency Medicine | Admitting: Emergency Medicine

## 2018-02-05 DIAGNOSIS — R339 Retention of urine, unspecified: Secondary | ICD-10-CM | POA: Insufficient documentation

## 2018-02-05 DIAGNOSIS — R1030 Lower abdominal pain, unspecified: Secondary | ICD-10-CM | POA: Insufficient documentation

## 2018-02-05 DIAGNOSIS — Z87891 Personal history of nicotine dependence: Secondary | ICD-10-CM | POA: Insufficient documentation

## 2018-02-05 DIAGNOSIS — Z79899 Other long term (current) drug therapy: Secondary | ICD-10-CM | POA: Insufficient documentation

## 2018-02-05 DIAGNOSIS — Z7982 Long term (current) use of aspirin: Secondary | ICD-10-CM | POA: Diagnosis not present

## 2018-02-05 LAB — URINALYSIS, MICROSCOPIC (REFLEX)

## 2018-02-05 LAB — URINALYSIS, ROUTINE W REFLEX MICROSCOPIC
Bilirubin Urine: NEGATIVE
Glucose, UA: NEGATIVE mg/dL
Ketones, ur: NEGATIVE mg/dL
Leukocytes, UA: NEGATIVE
NITRITE: NEGATIVE
PROTEIN: NEGATIVE mg/dL
SPECIFIC GRAVITY, URINE: 1.02 (ref 1.005–1.030)
pH: 5.5 (ref 5.0–8.0)

## 2018-02-05 NOTE — ED Notes (Signed)
Leg bag applied and pt instructed on how to change and empty. Verbalizes understanding. No questions.

## 2018-02-05 NOTE — Discharge Instructions (Signed)
Please read and follow all provided instructions.  Your diagnoses today include:  1. Urinary retention     Tests performed today include:  Urine test - does not show infection  Vital signs. See below for your results today.   Medications prescribed:   None  Take any prescribed medications only as directed.  Home care instructions:  Follow any educational materials contained in this packet.  BE VERY CAREFUL not to take multiple medicines containing Tylenol (also called acetaminophen). Doing so can lead to an overdose which can damage your liver and cause liver failure and possibly death.   Follow-up instructions: Please follow-up with your urologist this week for voiding trial.   Return instructions:   Please return to the Emergency Department if you experience worsening symptoms.   Please return if you have any other emergent concerns.  Additional Information:  Your vital signs today were: BP 123/77 (BP Location: Right Arm)    Pulse 75    Temp 98.5 F (36.9 C) (Oral)    Resp (!) 118    Ht 5\' 10"  (1.778 m)    Wt 106.6 kg (235 lb)    SpO2 95%    BMI 33.72 kg/m  If your blood pressure (BP) was elevated above 135/85 this visit, please have this repeated by your doctor within one month. --------------

## 2018-02-05 NOTE — ED Triage Notes (Signed)
Patient states that he has not been able to urinate since this am - patient states that he is started to have pain

## 2018-02-05 NOTE — ED Provider Notes (Signed)
[facility] EMERGENCY DEPARTMENT Provider Note   CSN: [id number] Arrival date & time: 02/05/18  1858     History   Chief Complaint Chief Complaint  Patient presents with  . Urinary Retention    HPI Jeffrey [name] is a 77 y.o. male.  Patient with history of BPH on tamsulosin presents with decreased urine stream today progressing to urinary retention.  Patient was last able to urinate around noon and at that time it was only a small amount.  Since that time he has had worsening pain in the lower abdomen.  He denies dysuria, fever, increased frequency or urgency prior.  No fevers, nausea or vomiting.  No treatments prior to arrival other than taking his prostate medication. The onset of this condition was acute. The course is worsening. Aggravating factors: none. Alleviating factors: none.       Past Medical History:  Diagnosis Date  . Hard of hearing   . Meningitis    2001  . Prostate disorder     Patient Active Problem List   Diagnosis Date Noted  . CAP (community acquired pneumonia) 06/28/2014  . Sepsis (HCC) 06/28/2014  . Hypoxia 06/28/2014    History reviewed. No pertinent surgical history.      Home Medications    Prior to Admission medications   Medication Sig Start Date End Date Taking? Authorizing Provider  aspirin 81 MG tablet Take 81 mg by mouth daily.    [provider]  guaiFENesin (MUCINEX) 600 MG 12 hr tablet Take 2 tablets (1,200 mg total) by mouth 2 (two) times daily. 06/30/14   Clydia Llano, MD  HYDROcodone-acetaminophen (NORCO) 5-325 MG per tablet Take 1 tablet by mouth every 6 (six) hours as needed for moderate pain. 06/30/14   Clydia Llano, MD  levofloxacin (LEVAQUIN) 750 MG tablet Take 1 tablet (750 mg total) by mouth daily. 06/30/14   Clydia Llano, MD  predniSONE (STERAPRED UNI-PAK) 10 MG tablet Take 6-5-4-3-2-1 till gone 06/30/14   Clydia Llano, MD  Red Yeast Rice 600 MG CAPS Take 600 mg by mouth daily.     [provider]  tamsulosin (FLOMAX) 0.4 MG CAPS capsule Take 0.8 mg by mouth daily after supper.     [provider]    Family History History reviewed. No pertinent family history.  Social History Social History   Tobacco Use  . Smoking status: Former Smoker    Packs/day: 1.50    Years: 44.00    Pack years: 66.00    Types: Cigarettes  . Smokeless tobacco: Never Used  Substance Use Topics  . Alcohol use: No  . Drug use: No     Allergies   Patient has no known allergies.   Review of Systems Review of Systems  Constitutional: Negative for fever.  HENT: Negative for rhinorrhea and sore throat.   Eyes: Negative for redness.  Respiratory: Negative for cough.   Cardiovascular: Negative for chest pain.  Gastrointestinal: Positive for abdominal pain. Negative for diarrhea, nausea and vomiting.  Genitourinary: Positive for decreased urine volume and difficulty urinating. Negative for dysuria.  Musculoskeletal: Negative for myalgias.  Skin: Negative for rash.  Neurological: Negative for headaches.     Physical Exam Updated Vital Signs BP (!) 145/91   Pulse (!) 105   Temp 98.1 F (36.7 C)   Resp 16   Ht 5\' 10"  (1.778 m)   Wt 106.6 kg (235 lb)   SpO2 95%   BMI 33.72 kg/m   Physical Exam  Constitutional: He appears well-developed and well-nourished.  HENT:  Head: Normocephalic and atraumatic.  Eyes: Conjunctivae are normal. Right eye exhibits no discharge. Left eye exhibits no discharge.  Neck: Normal range of motion. Neck supple.  Cardiovascular: Normal rate, regular rhythm and normal heart sounds.  Pulmonary/Chest: Effort normal and breath sounds normal.  Abdominal: Soft. He exhibits distension. He exhibits no mass. There is tenderness (suprapubic). There is no guarding.  Neurological: He is alert.  Skin: Skin is warm and dry.  Psychiatric: He has a normal mood and affect.  Nursing note and vitals reviewed.    ED Treatments / Results  Labs (all  labs ordered are listed, but only abnormal results are displayed) Labs Reviewed  URINALYSIS, ROUTINE W REFLEX MICROSCOPIC - Abnormal; Notable for the following components:      Result Value   Hgb urine dipstick TRACE (*)    All other components within normal limits  URINALYSIS, MICROSCOPIC (REFLEX) - Abnormal; Notable for the following components:   Bacteria, UA MANY (*)    All other components within normal limits    EKG None  Radiology No results found.  Procedures Procedures (including critical care time)  Medications Ordered in ED Medications - No data to display   Initial Impression / Assessment and Plan / ED Course  I have reviewed the triage vital signs and the nursing notes.  Pertinent labs & imaging results that were available during my care of the patient were reviewed by me and considered in my medical decision making (see chart for details).     Patient seen and examined. Work-up initiated.   Vital signs reviewed and are as follows: BP (!) 145/91   Pulse (!) 105   Temp 98.1 F (36.7 C)   Resp 16   Ht 5\' 10"  (1.778 m)   Wt 106.6 kg (235 lb)   SpO2 95%   BMI 33.72 kg/m   Patient feels much better after Foley placement.  We discussed acute urinary retention.  He has a urologist at the [facility].  He will continue his Flomax.  No signs of infection on UA. Discussed with Dr. Jacqulyn BathLong. Will d/c.   Patient urged to return with worsening symptoms or other concerns. Patient verbalized understanding and agrees with plan.    Final Clinical Impressions(s) / ED Diagnoses   Final diagnoses:  Urinary retention   Patient with acute urinary retention, no infection.  Symptoms started earlier today.  This is the patient's first episode.  Foley catheter placed with good results.  Patient counseled on need for follow-up.  Return with worsening.  ED Discharge Orders    None       Renne CriglerGeiple, [place] Stegemann, Cordelia Poche-C 02/05/18 2032    Maia PlanLong, Zedekiah Hinderman G, MD 02/06/18 [phone]

## 2018-02-05 NOTE — ED Notes (Signed)
Patient was instructed on how to change the leg bag and the drainage bag.

## 2019-08-28 ENCOUNTER — Emergency Department (HOSPITAL_BASED_OUTPATIENT_CLINIC_OR_DEPARTMENT_OTHER): Payer: PPO

## 2019-08-28 ENCOUNTER — Emergency Department (HOSPITAL_BASED_OUTPATIENT_CLINIC_OR_DEPARTMENT_OTHER)
Admission: EM | Admit: 2019-08-28 | Discharge: 2019-08-28 | Disposition: A | Discharge status: Home / Self Care | Payer: PPO | Attending: Emergency Medicine | Admitting: Emergency Medicine

## 2019-08-28 ENCOUNTER — Encounter (HOSPITAL_BASED_OUTPATIENT_CLINIC_OR_DEPARTMENT_OTHER): Payer: Self-pay | Admitting: *Deleted

## 2019-08-28 ENCOUNTER — Other Ambulatory Visit: Payer: Self-pay

## 2019-08-28 DIAGNOSIS — Z79899 Other long term (current) drug therapy: Secondary | ICD-10-CM | POA: Diagnosis not present

## 2019-08-28 DIAGNOSIS — Z20822 Contact with and (suspected) exposure to covid-19: Secondary | ICD-10-CM

## 2019-08-28 DIAGNOSIS — R509 Fever, unspecified: Secondary | ICD-10-CM | POA: Diagnosis not present

## 2019-08-28 DIAGNOSIS — Z87891 Personal history of nicotine dependence: Secondary | ICD-10-CM | POA: Diagnosis not present

## 2019-08-28 DIAGNOSIS — J189 Pneumonia, unspecified organism: Secondary | ICD-10-CM

## 2019-08-28 DIAGNOSIS — R000 Tachycardia, unspecified: Secondary | ICD-10-CM | POA: Diagnosis not present

## 2019-08-28 DIAGNOSIS — Z7982 Long term (current) use of aspirin: Secondary | ICD-10-CM | POA: Diagnosis not present

## 2019-08-28 DIAGNOSIS — R05 Cough: Secondary | ICD-10-CM | POA: Diagnosis not present

## 2019-08-28 DIAGNOSIS — U071 COVID-19: Secondary | ICD-10-CM | POA: Diagnosis not present

## 2019-08-28 LAB — COMPREHENSIVE METABOLIC PANEL
ALT: 24 U/L (ref 0–44)
AST: 35 U/L (ref 15–41)
Albumin: 3.8 g/dL (ref 3.5–5.0)
Alkaline Phosphatase: 42 U/L (ref 38–126)
Anion gap: 11 (ref 5–15)
BUN: 14 mg/dL (ref 8–23)
CO2: 26 mmol/L (ref 22–32)
Calcium: 8.6 mg/dL — ABNORMAL LOW (ref 8.9–10.3)
Chloride: 100 mmol/L (ref 98–111)
Creatinine, Ser: 1.13 mg/dL (ref 0.61–1.24)
GFR calc Af Amer: >60 mL/min (ref >60)
GFR calc non Af Amer: >60 mL/min (ref >60)
Glucose, Bld: 113 mg/dL — ABNORMAL HIGH (ref 70–99)
Potassium: 3.4 mmol/L — ABNORMAL LOW (ref 3.5–5.1)
Sodium: 137 mmol/L (ref 135–145)
Total Bilirubin: 0.6 mg/dL (ref 0.3–1.2)
Total Protein: 7.7 g/dL (ref 6.5–8.1)

## 2019-08-28 LAB — CBC WITH DIFFERENTIAL/PLATELET
Abs Immature Granulocytes: 0.04 10*3/uL (ref 0.00–0.07)
Basophils Absolute: 0 10*3/uL (ref 0.0–0.1)
Basophils Relative: 0 %
Eosinophils Absolute: 0 10*3/uL (ref 0.0–0.5)
Eosinophils Relative: 0 %
HCT: 45.4 % (ref 39.0–52.0)
Hemoglobin: 15.2 g/dL (ref 13.0–17.0)
Immature Granulocytes: 1 %
Lymphocytes Relative: 16 %
Lymphs Abs: 0.8 10*3/uL (ref 0.7–4.0)
MCH: 30.6 pg (ref 26.0–34.0)
MCHC: 33.5 g/dL (ref 30.0–36.0)
MCV: 91.5 fL (ref 80.0–100.0)
Monocytes Absolute: 0.5 10*3/uL (ref 0.1–1.0)
Monocytes Relative: 10 %
Neutro Abs: 3.8 10*3/uL (ref 1.7–7.7)
Neutrophils Relative %: 73 %
Platelets: 128 10*3/uL — ABNORMAL LOW (ref 150–400)
RBC: 4.96 MIL/uL (ref 4.22–5.81)
RDW: 12.7 % (ref 11.5–15.5)
WBC: 5.1 10*3/uL (ref 4.0–10.5)
nRBC: 0 % (ref 0.0–0.2)

## 2019-08-28 LAB — SARS CORONAVIRUS 2 AG (30 MIN TAT): SARS Coronavirus 2 Ag: NEGATIVE

## 2019-08-28 LAB — C-REACTIVE PROTEIN: CRP: 9.6 mg/dL — ABNORMAL HIGH (ref <1.0)

## 2019-08-28 LAB — TRIGLYCERIDES: Triglycerides: 58 mg/dL (ref <150)

## 2019-08-28 LAB — PROCALCITONIN: Procalcitonin: <0.1 ng/mL

## 2019-08-28 LAB — LACTATE DEHYDROGENASE: LDH: 209 U/L — ABNORMAL HIGH (ref 98–192)

## 2019-08-28 LAB — FERRITIN: Ferritin: 103 ng/mL (ref 24–336)

## 2019-08-28 LAB — LACTIC ACID, PLASMA: Lactic Acid, Venous: 1 mmol/L (ref 0.5–1.9)

## 2019-08-28 LAB — D-DIMER, QUANTITATIVE: D-Dimer, Quant: 0.72 ug/mL-FEU — ABNORMAL HIGH (ref 0.00–0.50)

## 2019-08-28 LAB — FIBRINOGEN: Fibrinogen: 614 mg/dL — ABNORMAL HIGH (ref 210–475)

## 2019-08-28 LAB — SARS CORONAVIRUS 2 (TAT 6-24 HRS): SARS Coronavirus 2: POSITIVE — AB

## 2019-08-28 MED ORDER — IOHEXOL 350 MG/ML SOLN
100.0000 mL | Freq: Once | INTRAVENOUS | Status: AC | PRN
  Administered 2019-08-28: 16:00:00 100 mL via INTRAVENOUS

## 2019-08-28 MED ORDER — SODIUM CHLORIDE 0.9 % IV SOLN
1000.0000 mL | INTRAVENOUS | Status: DC
  Administered 2019-08-28: 1000 mL via INTRAVENOUS

## 2019-08-28 MED ORDER — AZITHROMYCIN 250 MG PO TABS: ORAL_TABLET | ORAL | 0 refills | Status: DC

## 2019-08-28 NOTE — ED Provider Notes (Signed)
  Provider Note MRN:  [id number]  Arrival date & time: 08/28/19    ED Course and Medical Decision Making  Assumed care from Dr. Vallery Ridge at shift change.  Presumed viral illness, likely coronavirus, awaiting CT to exclude pulmonary embolism.  4:40 PM update: CT scan without PE, likely viral pneumonia.  Patient was ambulated with pulse ox and did well, largely asymptomatic, oxygen saturations staying above 91%.  Appropriate for discharge with strict return precautions, will cover for CAP with azithromycin.  Procedures  Final Clinical Impressions(s) / ED Diagnoses     ICD-10-CM   1. Suspected COVID-19 virus infection  Z20.828   2. Community acquired pneumonia, unspecified laterality  J18.9     ED Discharge Orders         Ordered    azithromycin (ZITHROMAX) 250 MG tablet     08/28/19 1645            Discharge Instructions     You were evaluated in the Emergency Department and after careful evaluation, we did not find any emergent condition requiring admission or further testing in the hospital.  Your exam/testing today was overall reassuring.  We suspect that your symptoms are due to COVID-19.  You will receive your formal test results within the next 2 days.  We will provide you with some antibiotics just in case your symptoms are related to a bacterial pneumonia.  Please take the azithromycin as directed.  Please return to the Emergency Department if you experience any worsening of your condition.  We encourage you to follow up with a primary care provider.  Thank you for allowing [place] to be a part of your care.     Barth Kirks. Sedonia Small, Newborn [email or web address]    Maudie Flakes, MD 08/28/19 [phone]

## 2019-08-28 NOTE — ED Triage Notes (Signed)
Pt reports slight cough, body aches and subjective fever x several days. Exposed to covid 9 days ago.

## 2019-08-28 NOTE — ED Provider Notes (Signed)
[facility] EMERGENCY DEPARTMENT Provider Note   CSN: [id number] Arrival date & time: 08/28/19  1136     History Chief Complaint  Patient presents with  . Fever    Jeffrey [name] is a 78 y.o. male.  HPI Patient reports that he had a Covid exposure approximately 9 days ago.  He thought he was doing pretty well.  He has had a fever and body aches.  He reports that he has had a cough.  He is noting now that if he does any exertion his respiratory rate is increasing.  He is not feeling severely short of breath.  He does note that he has had a significant loss of appetite and a lot of fatigue.    Past Medical History:  Diagnosis Date  . Hard of hearing   . Meningitis    2001  . Prostate disorder     Patient Active Problem List   Diagnosis Date Noted  . CAP (community acquired pneumonia) 06/28/2014  . Sepsis ([place]) 06/28/2014  . Hypoxia 06/28/2014    History reviewed. No pertinent surgical history.     History reviewed. No pertinent family history.  Social History   Tobacco Use  . Smoking status: Former Smoker    Packs/day: 1.50    Years: 44.00    Pack years: 66.00    Types: Cigarettes  . Smokeless tobacco: Never Used  Substance Use Topics  . Alcohol use: No  . Drug use: No    Home Medications Prior to Admission medications   Medication Sig Start Date End Date Taking? Authorizing Provider  aspirin 81 MG tablet Take 81 mg by mouth daily.    [provider]  guaiFENesin (MUCINEX) 600 MG 12 hr tablet Take 2 tablets (1,200 mg total) by mouth 2 (two) times daily. 06/30/14   Verlee Monte, MD  HYDROcodone-acetaminophen (NORCO) 5-325 MG per tablet Take 1 tablet by mouth every 6 (six) hours as needed for moderate pain. 06/30/14   Verlee Monte, MD  levofloxacin (LEVAQUIN) 750 MG tablet Take 1 tablet (750 mg total) by mouth daily. 06/30/14   Verlee Monte, MD  predniSONE (STERAPRED UNI-PAK) 10 MG tablet Take 6-5-4-3-2-1 till gone 06/30/14   Verlee Monte, MD  Red Yeast Rice 600 MG CAPS Take 600 mg by mouth daily.     [provider]  tamsulosin (FLOMAX) 0.4 MG CAPS capsule Take 0.8 mg by mouth daily after supper.     [provider]    Allergies    Patient has no known allergies.  Review of Systems   Review of Systems 10 Systems reviewed and are negative for acute change except as noted in the HPI.  Physical Exam Updated Vital Signs BP 118/65 (BP Location: Right Arm)   Pulse 100   Temp 100.1 F (37.8 C) (Oral)   Resp (!) 50   Ht 5\' 10"  (1.778 m)   Wt 108.9 kg   SpO2 95%   BMI 34.44 kg/m   Physical Exam Constitutional:      Comments: Alert with clear mental status.  Tachypnea but no respiratory distress at rest.  HENT:     Head: Normocephalic and atraumatic.     Mouth/Throat:     Mouth: Mucous membranes are moist.     Pharynx: Oropharynx is clear.  Eyes:     Extraocular Movements: Extraocular movements intact.  Cardiovascular:     Comments: Borderline tachycardia.  No rub murmur gallop. Pulmonary:     Comments: Tachypnea.  Adequate airflow.  Occasional crackle. Abdominal:     General: There is no distension.     Palpations: Abdomen is soft.     Tenderness: There is no abdominal tenderness. There is no guarding.  Musculoskeletal:        General: No swelling or tenderness. Normal range of motion.  Skin:    General: Skin is warm and dry.  Neurological:     General: No focal deficit present.     Mental Status: He is oriented to person, place, and time.     Coordination: Coordination normal.  Psychiatric:        Mood and Affect: Mood normal.     ED Results / Procedures / Treatments   Labs (all labs ordered are listed, but only abnormal results are displayed) Labs Reviewed - No data to display  EKG EKG Interpretation  Date/Time:  Monday August 28 2019 13:04:15 EST Ventricular Rate:  87 PR Interval:    QRS Duration: 102 QT Interval:  354 QTC Calculation: 426 R Axis:   88 Text  Interpretation: Sinus rhythm Ventricular premature complex Borderline right axis deviation normal except PVC, no change from old Confirmed by Arby Barrette [phone]) on 08/28/2019 2:23:26 PM   Radiology No results found.  Procedures Procedures (including critical care time)  Medications Ordered in ED Medications - No data to display  ED Course  I have reviewed the triage vital signs and the nursing notes.  Pertinent labs & imaging results that were available during my care of the patient were reviewed by me and considered in my medical decision making (see chart for details).  Clinical Course as of Aug 28 1519  Mon Aug 28, 2019  1459 Patient's Covid testing is returning negative.  Chest x-ray has some limited appearance of possible infiltrate versus atelectasis.  Patient has been tachypneic and some hypoxia ranging from 91 to 96%.  D-dimer elevated.  Will proceed with CTA of the chest to rule out PE.   [MP]    Clinical Course User Index [MP] Arby Barrette, MD   MDM Rules/Calculators/A&P                      Patient presents with possible Covid exposure.  He does describe typical symptoms.  He has had tachypnea and some hypoxia ranging from 91 to 96%.  Rapid Covid testing has returned negative.  We will proceed with CT angio of the chest to rule out PE.  Dr. Pilar Plate to follow-up on results and make final disposition based on results and patient condition. Final Clinical Impression(s) / ED Diagnoses Final diagnoses:  None    Rx / DC Orders ED Discharge Orders    None       Arby Barrette, MD 08/28/19 [phone]

## 2019-08-28 NOTE — Discharge Instructions (Addendum)
You were evaluated in the Emergency Department and after careful evaluation, we did not find any emergent condition requiring admission or further testing in the hospital.  Your exam/testing today was overall reassuring.  We suspect that your symptoms are due to COVID-19.  You will receive your formal test results within the next 2 days.  We will provide you with some antibiotics just in case your symptoms are related to a bacterial pneumonia.  Please take the azithromycin as directed.  Please return to the Emergency Department if you experience any worsening of your condition.  We encourage you to follow up with a primary care provider.  Thank you for allowing [place] to be a part of your care.

## 2019-08-28 NOTE — ED Notes (Signed)
PT ambulated to the restroom on pulse ox with a steady gait and no assistance. PT saturation remained at 91% throughout ambulation.

## 2019-08-28 NOTE — ED Notes (Signed)
Patient transported to CT 

## 2019-08-29 ENCOUNTER — Telehealth: Payer: Self-pay | Admitting: Unknown Physician Specialty

## 2019-08-29 ENCOUNTER — Telehealth (HOSPITAL_COMMUNITY): Payer: Self-pay

## 2019-08-29 NOTE — Telephone Encounter (Signed)
Called to discuss with patient about Covid symptoms and the use of bamlanivimab, a monoclonal antibody infusion for those with mild to moderate Covid symptoms and at a high risk of hospitalization.  Pt is qualified for this infusion at the [facility] due to Age > 65   Message left to call back  

## 2019-08-30 ENCOUNTER — Telehealth (HOSPITAL_COMMUNITY): Payer: Self-pay

## 2019-09-02 LAB — CULTURE, BLOOD (ROUTINE X 2)
Culture: NO GROWTH
Culture: NO GROWTH
Special Requests: ADEQUATE
Special Requests: ADEQUATE

## 2019-09-07 ENCOUNTER — Telehealth: Payer: Self-pay | Admitting: Unknown Physician Specialty

## 2019-09-07 NOTE — Telephone Encounter (Signed)
Pt returned call left last week.  He is doing better and out of therapeutic range for Covid 19 mab treatment.

## 2019-10-12 ENCOUNTER — Emergency Department (HOSPITAL_BASED_OUTPATIENT_CLINIC_OR_DEPARTMENT_OTHER)
Admission: EM | Admit: 2019-10-12 | Discharge: 2019-10-12 | Disposition: A | Discharge status: Home / Self Care | Payer: PPO | Attending: Emergency Medicine | Admitting: Emergency Medicine

## 2019-10-12 ENCOUNTER — Encounter (HOSPITAL_BASED_OUTPATIENT_CLINIC_OR_DEPARTMENT_OTHER): Payer: Self-pay | Admitting: Emergency Medicine

## 2019-10-12 DIAGNOSIS — Z79899 Other long term (current) drug therapy: Secondary | ICD-10-CM | POA: Diagnosis not present

## 2019-10-12 DIAGNOSIS — T83091A Other mechanical complication of indwelling urethral catheter, initial encounter: Secondary | ICD-10-CM | POA: Diagnosis not present

## 2019-10-12 DIAGNOSIS — R339 Retention of urine, unspecified: Secondary | ICD-10-CM | POA: Diagnosis present

## 2019-10-12 DIAGNOSIS — N390 Urinary tract infection, site not specified: Secondary | ICD-10-CM | POA: Insufficient documentation

## 2019-10-12 DIAGNOSIS — R509 Fever, unspecified: Secondary | ICD-10-CM | POA: Diagnosis not present

## 2019-10-12 DIAGNOSIS — Z87891 Personal history of nicotine dependence: Secondary | ICD-10-CM | POA: Diagnosis not present

## 2019-10-12 DIAGNOSIS — T839XXA Unspecified complication of genitourinary prosthetic device, implant and graft, initial encounter: Secondary | ICD-10-CM

## 2019-10-12 LAB — URINALYSIS, ROUTINE W REFLEX MICROSCOPIC
Glucose, UA: NEGATIVE mg/dL
Ketones, ur: NEGATIVE mg/dL
Nitrite: POSITIVE — AB
Protein, ur: 100 mg/dL — AB
Specific Gravity, Urine: >1.03 — ABNORMAL HIGH (ref 1.005–1.030)
pH: 5.5 (ref 5.0–8.0)

## 2019-10-12 LAB — URINALYSIS, MICROSCOPIC (REFLEX)
RBC / HPF: >50 RBC/hpf (ref 0–5)
Squamous Epithelial / HPF: NONE SEEN (ref 0–5)

## 2019-10-12 MED ORDER — CEPHALEXIN 250 MG PO CAPS
500.0000 mg | ORAL_CAPSULE | Freq: Once | ORAL | Status: AC
  Administered 2019-10-12: 500 mg via ORAL
  Filled 2019-10-12: qty 2

## 2019-10-12 MED ORDER — ACETAMINOPHEN 325 MG PO TABS
650.0000 mg | ORAL_TABLET | Freq: Once | ORAL | Status: AC
  Administered 2019-10-12: 650 mg via ORAL
  Filled 2019-10-12: qty 2

## 2019-10-12 MED ORDER — CEPHALEXIN 500 MG PO CAPS: 500.0000 mg | ORAL_CAPSULE | Freq: Four times a day (QID) | ORAL | 0 refills | Status: DC

## 2019-10-12 NOTE — ED Triage Notes (Signed)
Pt states he has had problems with urinary retention in the past and he had had to have catheters in the past  Pt states he attempted to cath himself at home but noticed a few drops of blood and met resistance so he quit   Pt states the last time he was able to pass urine was Wednesday morning  States he has been dribbling some but not able to have a stream  Pt states he also has a fever and he has noticed a few drops of blood in his urine as well

## 2019-10-12 NOTE — ED Provider Notes (Signed)
[facility] DEPT [facility] Provider Note: Georgena Spurling, MD, FACEP  CSN: [id number] MRN: [id number] ARRIVAL: 10/12/19 at [place]: [place]  Urinary Retention and Fever   HISTORY OF PRESENT ILLNESS  10/12/19 2:55 AM Jeffrey [name] is a 79 y.o. male with a history of urinary retention.  He is here with difficulty urinating since yesterday morning.  He has been dribbling some but not passing an actual stream.  Believing he may be retaining urine he attempted to catheterize himself but without return of significant urine.  After this attempt he has noticed some drops of blood from his urethral meatus.  He still has the sensation of needing to void which he describes as a pressure and rates it 8 out of 10.  This pressure is located in his suprapubic region.  He has also had a fever which was as high as 100.9 here.   Past Medical History:  Diagnosis Date  . Hard of hearing   . Meningitis    2001  . Prostate disorder     History reviewed. No pertinent surgical history.  History reviewed. No pertinent family history.  Social History   Tobacco Use  . Smoking status: Former Smoker    Packs/day: 1.50    Years: 44.00    Pack years: 66.00    Types: Cigarettes  . Smokeless tobacco: Never Used  Substance Use Topics  . Alcohol use: No  . Drug use: No    Prior to Admission medications   Medication Sig Start Date End Date Taking? Authorizing Provider  aspirin 81 MG tablet Take 81 mg by mouth daily.    [provider]  azithromycin (ZITHROMAX) 250 MG tablet Take 2 tablets together on the first day, then 1 every day until finished. 08/28/19   Maudie Flakes, MD  guaiFENesin (MUCINEX) 600 MG 12 hr tablet Take 2 tablets (1,200 mg total) by mouth 2 (two) times daily. 06/30/14   Verlee Monte, MD  HYDROcodone-acetaminophen (NORCO) 5-325 MG per tablet Take 1 tablet by mouth every 6 (six) hours as needed for moderate pain. 06/30/14   Verlee Monte, MD   levofloxacin (LEVAQUIN) 750 MG tablet Take 1 tablet (750 mg total) by mouth daily. 06/30/14   Verlee Monte, MD  predniSONE (STERAPRED UNI-PAK) 10 MG tablet Take 6-5-4-3-2-1 till gone 06/30/14   Verlee Monte, MD  Red Yeast Rice 600 MG CAPS Take 600 mg by mouth daily.     [provider]  tamsulosin (FLOMAX) 0.4 MG CAPS capsule Take 0.8 mg by mouth daily after supper.     [provider]    Allergies Patient has no known allergies.   REVIEW OF SYSTEMS  Negative except as noted here or in the History of Present Illness.   PHYSICAL EXAMINATION  Initial Vital Signs Blood pressure 107/67, pulse (!) 128, temperature (!) 100.9 F (38.3 C), temperature source Oral, resp. rate 20, height 5' 10.5" (1.791 m), weight 108.9 kg, SpO2 90 %.  Examination General: Well-developed, well-nourished male in no acute distress; appearance consistent with age of record HENT: normocephalic; atraumatic Eyes: pupils equal, round and reactive to light; extraocular muscles intact Neck: supple Heart: regular rate and rhythm; tachycardia Lungs: clear to auscultation bilaterally Abdomen: soft; nondistended; palpation of bladder reproduces urge to urinate; bowel sounds present GU: Tanner V male, uncircumcised; blood at urethral meatus Extremities: No deformity; full range of motion; pulses normal Neurologic: Awake, alert and oriented; motor function intact in all extremities and symmetric; no  facial droop; hard of hearing Skin: Warm and dry Psychiatric: Normal mood and affect   RESULTS  Summary of this visit's results, reviewed and interpreted by myself:   EKG Interpretation  Date/Time:    Ventricular Rate:    PR Interval:    QRS Duration:   QT Interval:    QTC Calculation:   R Axis:     Text Interpretation:        Laboratory Studies: Results for orders placed or performed during the hospital encounter of 10/12/19 (from the past 24 hour(s))  Urinalysis, Routine w reflex  microscopic     Status: Abnormal   Collection Time: 10/12/19  2:57 AM  Result Value Ref Range   Color, Urine BROWN (A) YELLOW   APPearance CLOUDY (A) CLEAR   Specific Gravity, Urine >1.030 (H) 1.005 - 1.030   pH 5.5 5.0 - 8.0   Glucose, UA NEGATIVE NEGATIVE mg/dL   Hgb urine dipstick LARGE (A) NEGATIVE   Bilirubin Urine SMALL (A) NEGATIVE   Ketones, ur NEGATIVE NEGATIVE mg/dL   Protein, ur 789 (A) NEGATIVE mg/dL   Nitrite POSITIVE (A) NEGATIVE   Leukocytes,Ua SMALL (A) NEGATIVE  Urinalysis, Microscopic (reflex)     Status: Abnormal   Collection Time: 10/12/19  2:57 AM  Result Value Ref Range   RBC / HPF >50 0 - 5 RBC/hpf   WBC, UA 11-20 0 - 5 WBC/hpf   Bacteria, UA MANY (A) NONE SEEN   Squamous Epithelial / LPF NONE SEEN 0 - 5   Imaging Studies: No results found.  ED COURSE and MDM  Nursing notes, initial and subsequent vitals signs, including pulse oximetry, reviewed and interpreted by myself.  Vitals:   10/12/19 0248 10/12/19 0251  BP: 107/67   Pulse: (!) 128   Resp: 20   Temp: (!) 100.9 F (38.3 C)   TempSrc: Oral   SpO2: 90%   Weight:  108.9 kg  Height:  5' 10.5" (1.791 m)   Medications  acetaminophen (TYLENOL) tablet 650 mg (has no administration in time range)  cephALEXin (KEFLEX) capsule 500 mg (has no administration in time range)    3:10 AM Bedside bladder scan showed only about 65 mL of postvoid residual so Foley catheter not placed.  Patient was able to voluntarily void about 50 mL of bloody urine.  Urine sent for urinalysis and culture.  3:31 AM Urinalysis is consistent with a urinary tract infection.  Hematuria may be due to infection but may also be due to self-catheterization trauma.  PROCEDURES  Procedures   ED DIAGNOSES     ICD-10-CM   1. Lower urinary tract infectious disease  N39.0   2. Complication of Foley catheter, initial encounter (HCC)  T83.9XXA        Jeffrey [name], Jeffrey Ruiz, MD 10/12/19 [phone]

## 2019-10-14 ENCOUNTER — Emergency Department (HOSPITAL_BASED_OUTPATIENT_CLINIC_OR_DEPARTMENT_OTHER)
Admission: EM | Admit: 2019-10-14 | Discharge: 2019-10-14 | Disposition: A | Discharge status: Home / Self Care | Payer: PPO | Attending: Emergency Medicine | Admitting: Emergency Medicine

## 2019-10-14 ENCOUNTER — Encounter (HOSPITAL_BASED_OUTPATIENT_CLINIC_OR_DEPARTMENT_OTHER): Payer: Self-pay | Admitting: Emergency Medicine

## 2019-10-14 ENCOUNTER — Other Ambulatory Visit: Payer: Self-pay

## 2019-10-14 DIAGNOSIS — R339 Retention of urine, unspecified: Secondary | ICD-10-CM | POA: Insufficient documentation

## 2019-10-14 DIAGNOSIS — N3090 Cystitis, unspecified without hematuria: Secondary | ICD-10-CM | POA: Insufficient documentation

## 2019-10-14 DIAGNOSIS — R338 Other retention of urine: Secondary | ICD-10-CM

## 2019-10-14 DIAGNOSIS — Z87891 Personal history of nicotine dependence: Secondary | ICD-10-CM | POA: Insufficient documentation

## 2019-10-14 DIAGNOSIS — Z79899 Other long term (current) drug therapy: Secondary | ICD-10-CM | POA: Diagnosis not present

## 2019-10-14 DIAGNOSIS — B9689 Other specified bacterial agents as the cause of diseases classified elsewhere: Secondary | ICD-10-CM | POA: Insufficient documentation

## 2019-10-14 DIAGNOSIS — Z7982 Long term (current) use of aspirin: Secondary | ICD-10-CM | POA: Diagnosis not present

## 2019-10-14 LAB — URINE CULTURE: Culture: >=100000 — AB

## 2019-10-14 MED ORDER — CIPROFLOXACIN HCL 500 MG PO TABS
500.0000 mg | ORAL_TABLET | Freq: Once | ORAL | Status: AC
  Administered 2019-10-14: 01:00:00 500 mg via ORAL
  Filled 2019-10-14: qty 1

## 2019-10-14 MED ORDER — CIPROFLOXACIN HCL 500 MG PO TABS: 500.0000 mg | ORAL_TABLET | Freq: Two times a day (BID) | ORAL | 0 refills | Status: AC

## 2019-10-14 NOTE — ED Notes (Signed)
   Foley standard drainage bag converted into leg bag for patient discharge.  Patient given instructions on emptying bag and keeping area cleaned until following up with his PCP.

## 2019-10-14 NOTE — ED Notes (Signed)
  Patient bladder scanned at bedside which showed > 490 ml of urine.  MD notified

## 2019-10-14 NOTE — ED Triage Notes (Signed)
  Patient comes in with UTI symptoms and urinary retention.  Patient was seen on Wednesday night and diagnosed with UTI and sent home on antibiotics.  Patient states he has not urinated since noon and has pressure in his lower abdomen.  No fever or other symptoms.  Patient is A&O x4.  Pain 5/10

## 2019-10-14 NOTE — ED Provider Notes (Signed)
[facility] Provider Note: Lowella Dell, MD, FACEP  CSN: [id number] MRN: [id number] ARRIVAL: 10/14/19 at 0008 ROOM: MH02/MH02   CHIEF COMPLAINT  Urinary Retention   HISTORY OF PRESENT ILLNESS  10/14/19 12:23 AM Jeffrey [name] is a 79 y.o. male was diagnosed with a urinary tract infection on 10/12/2019 and placed on Keflex.  Urine culture grew out Klebsiella oxytoca but sensitivities have not yet resulted.  The patient returns complaining of inability to void since noon yesterday.  He has pressure in his lower abdomen which he rates as a 5 out of 10.  This discomfort is worse with palpation or movement.  He denies fever or chills.   Past Medical History:  Diagnosis Date  . Hard of hearing   . Meningitis    2001  . Prostate disorder     History reviewed. No pertinent surgical history.  History reviewed. No pertinent family history.  Social History   Tobacco Use  . Smoking status: Former Smoker    Packs/day: 1.50    Years: 44.00    Pack years: 66.00    Types: Cigarettes  . Smokeless tobacco: Never Used  Substance Use Topics  . Alcohol use: No  . Drug use: No    Prior to Admission medications   Medication Sig Start Date End Date Taking? Authorizing Provider  aspirin 81 MG tablet Take 81 mg by mouth daily.    [provider]  cephALEXin (KEFLEX) 500 MG capsule Take 1 capsule (500 mg total) by mouth 4 (four) times daily. 10/12/19   Fifi Schindler, MD  Red Yeast Rice 600 MG CAPS Take 600 mg by mouth daily.     [provider]  tamsulosin (FLOMAX) 0.4 MG CAPS capsule Take 0.8 mg by mouth daily after supper.     [provider]    Allergies Patient has no known allergies.   REVIEW OF SYSTEMS  Negative except as noted here or in the History of Present Illness.   PHYSICAL EXAMINATION  Initial Vital Signs Blood pressure (!) 154/90, pulse 100, temperature 97.9 F (36.6 C), temperature source Oral, resp. rate (!) 22, height 5\' 10"   (1.778 m), weight 108.9 kg, SpO2 97 %.  Examination General: Well-developed, well-nourished male in no acute distress; appearance consistent with age of record HENT: normocephalic; atraumatic Eyes: pupils equal, round and reactive to light; extraocular muscles intact; arcus senilis bilaterally Neck: supple Heart: regular rate and rhythm Lungs: clear to auscultation bilaterally Abdomen: soft; distended, tender bladder; bowel sounds present GU: Tanner V male, uncircumcised Extremities: No deformity; full range of motion; pulses normal Neurologic: Awake, alert and oriented; motor function intact in all extremities and symmetric; no facial droop; hard of hearing Skin: Warm and dry Psychiatric: Normal mood and affect   RESULTS  Summary of this visit's results, reviewed and interpreted by myself:   EKG Interpretation  Date/Time:    Ventricular Rate:    PR Interval:    QRS Duration:   QT Interval:    QTC Calculation:   R Axis:     Text Interpretation:        Laboratory Studies: No results found for this or any previous visit (from the past 24 hour(s)). Imaging Studies: No results found.  ED COURSE and MDM  Nursing notes, initial and subsequent vitals signs, including pulse oximetry, reviewed and interpreted by myself.  Vitals:   10/14/19 0020  BP: (!) 154/90  Pulse: 100  Resp: (!) 22  Temp: 97.9 F (36.6 C)  TempSrc: Oral  SpO2: 97%  Weight: 108.9 kg  Height: 5\' 10"  (1.778 m)   Medications  ciprofloxacin (CIPRO) tablet 500 mg (has no administration in time range)   12:43 AM Foley catheter placed by nursing staff with greater than 750 mL output (clear yellow urine).  Patient had significant relief of discomfort.  As noted above patient's urine grew out greater than 100,000 colonies of Klebsiella oxytoca.  We will switch to ciprofloxacin as this has better coverage against Klebsiella species then cephalosporins.  He was advised to make an appointment with his urologist  (his urologist is at the [facility]) as this is the third time he has suffered urinary retention and he will need follow-up for this potentially resistant organism.   PROCEDURES  Procedures   ED DIAGNOSES     ICD-10-CM   1. Acute urinary retention  R33.8   2. Klebsiella cystitis  N30.90    B96.89        Daysy Santini, Jenny Reichmann, MD 10/14/19 0045

## 2019-10-16 ENCOUNTER — Telehealth: Payer: Self-pay | Admitting: *Deleted

## 2019-10-16 NOTE — Telephone Encounter (Signed)
Post ED Visit - Positive Culture Follow-up  Culture report reviewed by antimicrobial stewardship pharmacist: [facility] []  , Pharm.D. []  Enzo Bi, Pharm.D., BCPS AQ-ID []  , Pharm.D., BCPS []  Celedonio Miyamoto, Pharm.D., BCPS []  Jal, Garvin Fila.D., BCPS, AAHIVP []  , Pharm.D., BCPS, AAHIVP [x]  Georgina Pillion, PharmD, BCPS []  , PharmD, BCPS []  [place], PharmD, BCPS []  [address], PharmD []  , PharmD, BCPS []  Estella Husk, PharmD  Pharmacy Team []  Lysle Pearl, PharmD []  , PharmD []  Phillips Climes, PharmD []  , Rph []  Agapito Games) , PharmD []  Verlan Friends, PharmD []  , PharmD []  Mervyn Gay, PharmD []  , PharmD []  Vinnie Level, PharmD []  [facility], PharmD []  , PharmD []  Len Childs, PharmD   Positive urine culture Treated with Cephalexin, organism sensitive to the same and no further patient follow-up is required at this time.  [facility] 10/16/2019, 9:00 AM

## 2019-11-06 ENCOUNTER — Encounter (HOSPITAL_BASED_OUTPATIENT_CLINIC_OR_DEPARTMENT_OTHER): Payer: Self-pay | Admitting: *Deleted

## 2019-11-06 ENCOUNTER — Emergency Department (HOSPITAL_BASED_OUTPATIENT_CLINIC_OR_DEPARTMENT_OTHER)
Admission: EM | Admit: 2019-11-06 | Discharge: 2019-11-07 | Disposition: A | Discharge status: Home / Self Care | Payer: PPO | Attending: Emergency Medicine | Admitting: Emergency Medicine

## 2019-11-06 ENCOUNTER — Other Ambulatory Visit: Payer: Self-pay

## 2019-11-06 DIAGNOSIS — Z7982 Long term (current) use of aspirin: Secondary | ICD-10-CM | POA: Diagnosis not present

## 2019-11-06 DIAGNOSIS — Z79899 Other long term (current) drug therapy: Secondary | ICD-10-CM | POA: Diagnosis not present

## 2019-11-06 DIAGNOSIS — Z87891 Personal history of nicotine dependence: Secondary | ICD-10-CM | POA: Insufficient documentation

## 2019-11-06 DIAGNOSIS — R339 Retention of urine, unspecified: Secondary | ICD-10-CM | POA: Insufficient documentation

## 2019-11-06 DIAGNOSIS — R338 Other retention of urine: Secondary | ICD-10-CM

## 2019-11-06 LAB — CBC WITH DIFFERENTIAL/PLATELET
Abs Immature Granulocytes: 0.05 10*3/uL (ref 0.00–0.07)
Basophils Absolute: 0.1 10*3/uL (ref 0.0–0.1)
Basophils Relative: 1 %
Eosinophils Absolute: 0.1 10*3/uL (ref 0.0–0.5)
Eosinophils Relative: 1 %
HCT: 39.6 % (ref 39.0–52.0)
Hemoglobin: 13.2 g/dL (ref 13.0–17.0)
Immature Granulocytes: 1 %
Lymphocytes Relative: 16 %
Lymphs Abs: 1.4 10*3/uL (ref 0.7–4.0)
MCH: 30.4 pg (ref 26.0–34.0)
MCHC: 33.3 g/dL (ref 30.0–36.0)
MCV: 91.2 fL (ref 80.0–100.0)
Monocytes Absolute: 0.9 10*3/uL (ref 0.1–1.0)
Monocytes Relative: 10 %
Neutro Abs: 6.5 10*3/uL (ref 1.7–7.7)
Neutrophils Relative %: 71 %
Platelets: 222 10*3/uL (ref 150–400)
RBC: 4.34 MIL/uL (ref 4.22–5.81)
RDW: 14.8 % (ref 11.5–15.5)
WBC: 9.1 10*3/uL (ref 4.0–10.5)
nRBC: 0 % (ref 0.0–0.2)

## 2019-11-06 LAB — URINALYSIS, ROUTINE W REFLEX MICROSCOPIC
Bilirubin Urine: NEGATIVE
Glucose, UA: NEGATIVE mg/dL
Ketones, ur: NEGATIVE mg/dL
Leukocytes,Ua: NEGATIVE
Nitrite: NEGATIVE
Protein, ur: NEGATIVE mg/dL
Specific Gravity, Urine: 1.01 (ref 1.005–1.030)
pH: 5.5 (ref 5.0–8.0)

## 2019-11-06 LAB — URINALYSIS, MICROSCOPIC (REFLEX): WBC, UA: NONE SEEN WBC/hpf (ref 0–5)

## 2019-11-06 LAB — BASIC METABOLIC PANEL
Anion gap: 11 (ref 5–15)
BUN: 13 mg/dL (ref 8–23)
CO2: 24 mmol/L (ref 22–32)
Calcium: 8.9 mg/dL (ref 8.9–10.3)
Chloride: 101 mmol/L (ref 98–111)
Creatinine, Ser: 0.92 mg/dL (ref 0.61–1.24)
GFR calc Af Amer: >60 mL/min (ref >60)
GFR calc non Af Amer: >60 mL/min (ref >60)
Glucose, Bld: 128 mg/dL — ABNORMAL HIGH (ref 70–99)
Potassium: 3.4 mmol/L — ABNORMAL LOW (ref 3.5–5.1)
Sodium: 136 mmol/L (ref 135–145)

## 2019-11-06 MED ORDER — POTASSIUM CHLORIDE CRYS ER 20 MEQ PO TBCR
40.0000 meq | EXTENDED_RELEASE_TABLET | Freq: Once | ORAL | Status: AC
  Administered 2019-11-06: 40 meq via ORAL
  Filled 2019-11-06: qty 2

## 2019-11-06 MED ORDER — SODIUM CHLORIDE 0.9 % IV BOLUS
1000.0000 mL | Freq: Once | INTRAVENOUS | Status: AC
  Administered 2019-11-06: 1000 mL via INTRAVENOUS

## 2019-11-06 NOTE — ED Triage Notes (Signed)
Urinary retention. Has not voided since this afternoon. He tried to self cath but only got bloody returns.

## 2019-11-06 NOTE — ED Provider Notes (Signed)
[facility] EMERGENCY DEPARTMENT Provider Note   CSN: [id number] Arrival date & time: 11/06/19  2006     History Chief Complaint  Patient presents with  . Urinary Retention    Jeffrey [name] is a 79 y.o. male.  HPI 80 year old male presents with acute urinary retention.  Last urinated around noon.  Has not been able to go since with progressively worsening lower abdominal pain.  Has had this before and tried to self cath but only got a little bit of blood.  He also donated some blood earlier in the day and has been able to urinate since.  Otherwise no fevers or vomiting.  He recently had a similar problem and had his catheter removed at the [place].  Denies any preceding urinary symptoms.  Past Medical History:  Diagnosis Date  . Hard of hearing   . Meningitis    2001  . Prostate disorder     Patient Active Problem List   Diagnosis Date Noted  . CAP (community acquired pneumonia) 06/28/2014  . Sepsis (HCC) 06/28/2014  . Hypoxia 06/28/2014    History reviewed. No pertinent surgical history.     No family history on file.  Social History   Tobacco Use  . Smoking status: Former Smoker    Packs/day: 1.50    Years: 44.00    Pack years: 66.00    Types: Cigarettes  . Smokeless tobacco: Never Used  Substance Use Topics  . Alcohol use: No  . Drug use: No    Home Medications Prior to Admission medications   Medication Sig Start Date End Date Taking? Authorizing Provider  aspirin 81 MG tablet Take 81 mg by mouth daily.    [provider]  ciprofloxacin (CIPRO) 500 MG tablet Take 1 tablet (500 mg total) by mouth 2 (two) times daily. One po bid x 7 days 10/14/19   Molpus, John, MD  Red Yeast Rice 600 MG CAPS Take 600 mg by mouth daily.     [provider]  tamsulosin (FLOMAX) 0.4 MG CAPS capsule Take 0.8 mg by mouth daily after supper.     [provider]    Allergies    Patient has no known allergies.  Review of Systems   Review of  Systems  Constitutional: Negative for fever.  Respiratory: Negative for cough and shortness of breath.   Gastrointestinal: Positive for abdominal pain. Negative for vomiting.  Genitourinary: Positive for difficulty urinating.  Musculoskeletal: Negative for back pain.  All other systems reviewed and are negative.   Physical Exam Updated Vital Signs BP 104/63 (BP Location: Right Arm)   Pulse 83   Temp 97.9 F (36.6 C) (Oral)   Resp 19   Ht 5\' 10"  (1.778 m)   Wt 104.3 kg   SpO2 94%   BMI 33.00 kg/m   Physical Exam Vitals and nursing note reviewed.  Constitutional:      General: He is not in acute distress.    Appearance: He is well-developed. He is not ill-appearing or diaphoretic.  HENT:     Head: Normocephalic and atraumatic.     Right Ear: External ear normal.     Left Ear: External ear normal.     Nose: Nose normal.  Eyes:     General:        Right eye: No discharge.        Left eye: No discharge.  Cardiovascular:     Rate and Rhythm: Normal rate and regular rhythm.  Heart sounds: Normal heart sounds.  Pulmonary:     Effort: Pulmonary effort is normal.     Breath sounds: Normal breath sounds.  Abdominal:     General: There is no distension.     Palpations: Abdomen is soft.     Tenderness: There is no abdominal tenderness. There is no right CVA tenderness or left CVA tenderness.  Genitourinary:    Comments: Foley catheter in place Musculoskeletal:     Cervical back: Neck supple.  Skin:    General: Skin is warm and dry.  Neurological:     Mental Status: He is alert.  Psychiatric:        Mood and Affect: Mood is not anxious.     ED Results / Procedures / Treatments   Labs (all labs ordered are listed, but only abnormal results are displayed) Labs Reviewed  URINALYSIS, ROUTINE W REFLEX MICROSCOPIC - Abnormal; Notable for the following components:      Result Value   Hgb urine dipstick LARGE (*)    All other components within normal limits   URINALYSIS, MICROSCOPIC (REFLEX) - Abnormal; Notable for the following components:   Bacteria, UA RARE (*)    All other components within normal limits  BASIC METABOLIC PANEL - Abnormal; Notable for the following components:   Potassium 3.4 (*)    Glucose, Bld 128 (*)    All other components within normal limits  URINE CULTURE  CBC WITH DIFFERENTIAL/PLATELET    EKG None  Radiology No results found.  Procedures Procedures (including critical care time)  Medications Ordered in ED Medications  sodium chloride 0.9 % bolus 1,000 mL ( Intravenous Stopped 11/06/19 2254)  potassium chloride SA (KLOR-CON) CR tablet 40 mEq (40 mEq Oral Given 11/06/19 2330)    ED Course  I have reviewed the triage vital signs and the nursing notes.  Pertinent labs & imaging results that were available during my care of the patient were reviewed by me and considered in my medical decision making (see chart for details).    MDM Rules/Calculators/A&P                      Urinary retention resolved after Foley catheter.  Over 400 cc out.  No UTI present.  Labs are reassuring besides mild hypokalemia.  He is [facility]  [facility].  Otherwise, he is already on Flomax. Final Clinical Impression(s) / ED Diagnoses Final diagnoses:  Acute urinary retention    Rx / DC Orders ED Discharge Orders    None       Sherwood Gambler, MD 11/07/19 0020

## 2019-11-06 NOTE — ED Notes (Signed)
PT asked for urine specimen but told he is retaining all urine at this time

## 2019-11-06 NOTE — ED Notes (Signed)
PT O2 at 90% when asleep. When aroused sat rises to 94%. RT, RN aware

## 2019-11-06 NOTE — ED Notes (Addendum)
PT had a bladder volume of when scanned by this tech

## 2019-11-08 LAB — URINE CULTURE: Culture: NO GROWTH

## 2019-11-10 ENCOUNTER — Other Ambulatory Visit: Payer: Self-pay | Admitting: Urology

## 2019-11-10 DIAGNOSIS — R338 Other retention of urine: Secondary | ICD-10-CM | POA: Diagnosis not present

## 2019-11-10 DIAGNOSIS — N401 Enlarged prostate with lower urinary tract symptoms: Secondary | ICD-10-CM | POA: Diagnosis not present

## 2019-11-10 DIAGNOSIS — Z125 Encounter for screening for malignant neoplasm of prostate: Secondary | ICD-10-CM | POA: Diagnosis not present

## 2019-11-11 ENCOUNTER — Inpatient Hospital Stay (HOSPITAL_COMMUNITY)
Admission: RE | Admit: 2019-11-11 | Discharge: 2019-11-11 | Disposition: A | Discharge status: Home / Self Care | Payer: PPO | Source: Ambulatory Visit

## 2019-11-11 NOTE — Progress Notes (Signed)
Pt returned the call and confirmed that he would not show up for testing today, and he will quarantine until his procedure on 3/17.

## 2019-11-11 NOTE — Progress Notes (Addendum)
LVM for pt not to show up to be tested for covid today due to pt testing + for covid on 08/28/2019. Based on the guidelines the pt is in the 90 day window to not retest. The pt is still expected to quarantine until their procedure. Therefore, the pt can have their procedure as scheduled. These are the guidelines as follows:  Guidance: Patient previously tested + COVID; now past 90 day window seeking elective surgery (asymptomatic)  Retest patient If negative, proceed with surgery If positive, postpone surgery for 10 days from positive test Patient to quarantine for the (10 days) Do not retest again prior to surgery (even if scheduled a couple of weeks out) Use standard precautions for surgery

## 2019-11-13 ENCOUNTER — Inpatient Hospital Stay (HOSPITAL_COMMUNITY): Admission: RE | Admit: 2019-11-13 | Payer: PPO | Source: Ambulatory Visit

## 2019-11-13 NOTE — Progress Notes (Signed)
DUE TO COVID-19 ONLY ONE VISITOR IS ALLOWED TO COME WITH YOU AND STAY IN THE WAITING ROOM ONLY DURING PRE OP AND PROCEDURE DAY OF SURGERY. THE 1 VISITOR MAY VISIT WITH YOU AFTER SURGERY IN YOUR PRIVATE ROOM DURING VISITING HOURS ONLY!  YOU NEED TO HAVE A COVID 19 TEST ON_______ @_______ , THIS TEST MUST BE DONE BEFORE SURGERY, COME  Jeffrey [name] , Jeffrey [name].  ([place]) ONCE YOUR COVID TEST IS COMPLETED, PLEASE BEGIN THE QUARANTINE INSTRUCTIONS AS OUTLINED IN YOUR HANDOUT.                Jeffrey [name]  11/13/2019   Your procedure is scheduled on:     11/15/2019   Report to [facility] Main  Entrance   Report to admitting at       0800AM     Call this number if you have problems the morning of surgery [phone]    Remember: Do not eat food or drink liquids :After Midnight. BRUSH YOUR TEETH MORNING OF SURGERY AND RINSE YOUR MOUTH OUT, NO CHEWING GUM CANDY OR MINTS.     Take these medicines the morning of surgery with A SIP OF WATER: none                                    You may not have any metal on your body including hair pins and              piercings  Do not wear jewelry, , lotions, powders or perfumes, deodorant                         Men may shave face and neck.   Do not bring valuables to the hospital. [place].  Contacts, dentures or bridgework may not be worn into surgery.  Leave suitcase in the car. After surgery it may be brought to your room.     Patients discharged the day of surgery will not be allowed to drive home. IF YOU ARE HAVING SURGERY AND GOING HOME THE SAME DAY, YOU MUST HAVE AN ADULT TO DRIVE YOU HOME AND BE WITH YOU FOR 24 HOURS. YOU MAY GO HOME BY TAXI OR UBER OR ORTHERWISE, BUT AN ADULT MUST ACCOMPANY YOU HOME AND STAY WITH YOU FOR 24 HOURS.  Name and phone number of your driver:  Special Instructions: N/A              Please read over the following fact  sheets you were given: _____________________________________________________________________             [facility] - Preparing for Surgery Before surgery, you can play an important role.  Because skin is not sterile, your skin needs to be as free of germs as possible.  You can reduce the number of germs on your skin by washing with CHG (chlorahexidine gluconate) soap before surgery.  CHG is an antiseptic cleaner which kills germs and bonds with the skin to continue killing germs even after washing. Please DO NOT use if you have an allergy to CHG or antibacterial soaps.  If your skin becomes reddened/irritated stop using the CHG and inform your nurse when you arrive at Short Stay. Do not shave (including legs and underarms) for  at least 48 hours prior to the first CHG shower.  You may shave your face/neck. Please follow these instructions carefully:  1.  Shower with CHG Soap the night before surgery and the  morning of Surgery.  2.  If you choose to wash your hair, wash your hair first as usual with your  normal  shampoo.  3.  After you shampoo, rinse your hair and body thoroughly to remove the  shampoo.                           4.  Use CHG as you would any other liquid soap.  You can apply chg directly  to the skin and wash                       Gently with a scrungie or clean washcloth.  5.  Apply the CHG Soap to your body ONLY FROM THE NECK DOWN.   Do not use on face/ open                           Wound or open sores. Avoid contact with eyes, ears mouth and genitals (private parts).                       Wash face,  Genitals (private parts) with your normal soap.             6.  Wash thoroughly, paying special attention to the area where your surgery  will be performed.  7.  Thoroughly rinse your body with warm water from the neck down.  8.  DO NOT shower/wash with your normal soap after using and rinsing off  the CHG Soap.                9.  Pat yourself dry with a clean towel.             10.  Wear clean pajamas.            11.  Place clean sheets on your bed the night of your first shower and do not  sleep with pets. Day of Surgery : Do not apply any lotions/deodorants the morning of surgery.  Please wear clean clothes to the hospital/surgery center.  FAILURE TO FOLLOW THESE INSTRUCTIONS MAY RESULT IN THE CANCELLATION OF YOUR SURGERY PATIENT SIGNATURE_________________________________  NURSE SIGNATURE__________________________________  ________________________________________________________________________

## 2019-11-14 ENCOUNTER — Other Ambulatory Visit: Payer: Self-pay | Admitting: Urology

## 2019-11-14 ENCOUNTER — Encounter (HOSPITAL_COMMUNITY)
Admission: RE | Admit: 2019-11-14 | Discharge: 2019-11-14 | Disposition: A | Discharge status: Home / Self Care | Payer: PPO | Source: Ambulatory Visit | Attending: Urology | Admitting: Urology

## 2019-11-14 ENCOUNTER — Encounter (HOSPITAL_COMMUNITY): Payer: Self-pay

## 2019-11-14 ENCOUNTER — Other Ambulatory Visit: Payer: Self-pay

## 2019-11-14 DIAGNOSIS — Z01818 Encounter for other preprocedural examination: Secondary | ICD-10-CM | POA: Insufficient documentation

## 2019-11-14 HISTORY — DX: Unspecified osteoarthritis, unspecified site: M19.90

## 2019-11-14 HISTORY — DX: Pneumonia, unspecified organism: J18.9

## 2019-11-14 HISTORY — DX: Chronic obstructive pulmonary disease, unspecified: J44.9

## 2019-11-14 HISTORY — DX: Unspecified hearing loss, unspecified ear: H91.90

## 2019-11-14 NOTE — Progress Notes (Signed)
PCP - Loyal Jacobson Cardiologist -   Chest x-ray - I VIEW 08-28-19 EPIC EKG - 08-28-19 EPIC Stress Test -  ECHO -  Cardiac Cath -  CBC/DIFF BMP, UA 11-06-19 Epic  COVID + 08-28-19 Epic  STILL EXPERIENCING SOME FATIGUE.   Sleep Study -  CPAP -   Fasting Blood Sugar -  Checks Blood Sugar _____ times a day  Blood Thinner Instructions: Aspirin Instructions: Last Dose:  Anesthesia review: mild copd no meds   Patient denies shortness of breath, fever, cough and chest pain at PAT appointment  NONE   Patient verbalized understanding of instructions that were given to them at the PAT appointment. Patient was also instructed that they will need to review over the PAT instructions again at home before surgery.

## 2019-11-14 NOTE — Patient Instructions (Signed)
DUE TO COVID-19 ONLY ONE VISITOR IS ALLOWED TO COME WITH YOU AND STAY IN THE WAITING ROOM ONLY DURING PRE OP AND PROCEDURE DAY OF SURGERY. THE 1 VISITOR MAY VISIT WITH YOU AFTER SURGERY IN YOUR PRIVATE ROOM DURING VISITING HOURS ONLY!  10 am -8pm  YOU NEED TO HAVE A COVID 19 TEST ON___do not need to repeat____ @_______ , THIS TEST MUST BE DONE BEFORE SURGERY, COME  [address], [place] , .  [facility]) ONCE YOUR COVID TEST IS COMPLETED, PLEASE BEGIN THE QUARANTINE INSTRUCTIONS AS OUTLINED IN YOUR HANDOUT.                AVIS MCMAHILL  11/14/2019   Your procedure is scheduled on: 11-15-2019    Report to [facility] Main  Entrance   Report to admitting at       0800AM     Call this number if you have problems the morning of surgery [phone]    Remember: Do not eat food or drink liquids :After Midnight.  BRUSH YOUR TEETH MORNING OF SURGERY AND RINSE YOUR MOUTH OUT, NO CHEWING GUM CANDY OR MINTS.     Take these medicines the morning of surgery with A SIP OF WATER: none                                    You may not have any metal on your body including hair pins and              piercings  Do not wear jewelry, , lotions, powders or perfumes, deodorant                         Men may shave face and neck.   Do not bring valuables to the hospital. [place] IS NOT             RESPONSIBLE   FOR VALUABLES.  Contacts, dentures or bridgework may not be worn into surgery.  Leave suitcase in the car. After surgery it may be brought to your room.     Patients discharged the day of surgery will not be allowed to drive home. IF YOU ARE HAVING SURGERY AND GOING HOME THE SAME DAY, YOU MUST HAVE AN ADULT TO DRIVE YOU HOME AND BE WITH YOU FOR 24 HOURS. YOU MAY GO HOME BY TAXI OR UBER OR ORTHERWISE, BUT AN ADULT MUST ACCOMPANY YOU HOME AND STAY WITH YOU FOR 24 HOURS.  Name and phone number of your driver:  Special Instructions: N/A   Please read over the following fact sheets you were given: _____________________________________________________________________             [facility] - Preparing for Surgery Before surgery, you can play an important role.  Because skin is not sterile, your skin needs to be as free of germs as possible.  You can reduce the number of germs on your skin by washing with CHG (chlorahexidine gluconate) soap before surgery.  CHG is an antiseptic cleaner which kills germs and bonds with the skin to continue killing germs even after washing. Please DO NOT use if you have an allergy to CHG or antibacterial soaps.  If your skin becomes reddened/irritated stop using the CHG and inform your nurse when you arrive at Short Stay. Do not shave (including legs and underarms) for at least 48 hours prior to  the first CHG shower.  You may shave your face/neck. Please follow these instructions carefully:  1.  Shower with CHG Soap the night before surgery and the  morning of Surgery.  2.  If you choose to wash your hair, wash your hair first as usual with your  normal  shampoo.  3.  After you shampoo, rinse your hair and body thoroughly to remove the  shampoo.                           4.  Use CHG as you would any other liquid soap.  You can apply chg directly  to the skin and wash                       Gently with a scrungie or clean washcloth.  5.  Apply the CHG Soap to your body ONLY FROM THE NECK DOWN.   Do not use on face/ open                           Wound or open sores. Avoid contact with eyes, ears mouth and genitals (private parts).                       Wash face,  Genitals (private parts) with your normal soap.             6.  Wash thoroughly, paying special attention to the area where your surgery  will be performed.  7.  Thoroughly rinse your body with warm water from the neck down.  8.  DO NOT shower/wash with your normal soap after using and rinsing off  the CHG Soap.                9.  Pat  yourself dry with a clean towel.            10.  Wear clean pajamas.            11.  Place clean sheets on your bed the night of your first shower and do not  sleep with pets. Day of Surgery : Do not apply any lotions/deodorants the morning of surgery.  Please wear clean clothes to the hospital/surgery center.  FAILURE TO FOLLOW THESE INSTRUCTIONS MAY RESULT IN THE CANCELLATION OF YOUR SURGERY PATIENT SIGNATURE_________________________________  NURSE SIGNATURE__________________________________  ________________________________________________________________________

## 2019-11-14 NOTE — Progress Notes (Signed)
Pt. Called for pre op call surgery 11-15-19 instructions reviewed on phone pt. Did not want to come and pick up bag with instructions and CHG soap . Pt. Instructed to take regular shower tonight and in the am .NPO after MN no meds in am and arrive 0800 am to admitting. the patient. VU understanding

## 2019-11-15 ENCOUNTER — Observation Stay (HOSPITAL_COMMUNITY)
Admission: RE | Admit: 2019-11-15 | Discharge: 2019-11-16 | Disposition: A | Discharge status: Home / Self Care | Payer: PPO | Attending: Urology | Admitting: Urology

## 2019-11-15 ENCOUNTER — Encounter (HOSPITAL_COMMUNITY)
Admission: RE | Disposition: A | Discharge status: Home / Self Care | Payer: Self-pay | Source: Home / Self Care | Attending: Urology

## 2019-11-15 ENCOUNTER — Ambulatory Visit (HOSPITAL_COMMUNITY): Payer: PPO | Admitting: Physician Assistant

## 2019-11-15 ENCOUNTER — Ambulatory Visit (HOSPITAL_COMMUNITY): Payer: PPO | Admitting: Anesthesiology

## 2019-11-15 ENCOUNTER — Encounter (HOSPITAL_COMMUNITY): Payer: Self-pay | Admitting: Urology

## 2019-11-15 DIAGNOSIS — N401 Enlarged prostate with lower urinary tract symptoms: Secondary | ICD-10-CM | POA: Diagnosis not present

## 2019-11-15 DIAGNOSIS — R0602 Shortness of breath: Secondary | ICD-10-CM | POA: Insufficient documentation

## 2019-11-15 DIAGNOSIS — R351 Nocturia: Secondary | ICD-10-CM | POA: Insufficient documentation

## 2019-11-15 DIAGNOSIS — N138 Other obstructive and reflux uropathy: Secondary | ICD-10-CM | POA: Insufficient documentation

## 2019-11-15 DIAGNOSIS — N419 Inflammatory disease of prostate, unspecified: Secondary | ICD-10-CM | POA: Diagnosis not present

## 2019-11-15 DIAGNOSIS — J449 Chronic obstructive pulmonary disease, unspecified: Secondary | ICD-10-CM | POA: Diagnosis not present

## 2019-11-15 DIAGNOSIS — N320 Bladder-neck obstruction: Secondary | ICD-10-CM | POA: Diagnosis not present

## 2019-11-15 DIAGNOSIS — R338 Other retention of urine: Secondary | ICD-10-CM | POA: Insufficient documentation

## 2019-11-15 DIAGNOSIS — Z87891 Personal history of nicotine dependence: Secondary | ICD-10-CM | POA: Diagnosis not present

## 2019-11-15 DIAGNOSIS — R350 Frequency of micturition: Secondary | ICD-10-CM | POA: Diagnosis not present

## 2019-11-15 DIAGNOSIS — R339 Retention of urine, unspecified: Secondary | ICD-10-CM | POA: Diagnosis present

## 2019-11-15 DIAGNOSIS — N400 Enlarged prostate without lower urinary tract symptoms: Secondary | ICD-10-CM | POA: Diagnosis not present

## 2019-11-15 HISTORY — PX: TRANSURETHRAL RESECTION OF PROSTATE: SHX73

## 2019-11-15 LAB — ABO/RH: ABO/RH(D): O POS

## 2019-11-15 LAB — TYPE AND SCREEN
ABO/RH(D): O POS
Antibody Screen: NEGATIVE

## 2019-11-15 LAB — HEMOGLOBIN AND HEMATOCRIT, BLOOD
HCT: 39.4 % (ref 39.0–52.0)
Hemoglobin: 12.7 g/dL — ABNORMAL LOW (ref 13.0–17.0)

## 2019-11-15 SURGERY — TURP (TRANSURETHRAL RESECTION OF PROSTATE)
Anesthesia: General

## 2019-11-15 MED ORDER — ONDANSETRON HCL 4 MG/2ML IJ SOLN
INTRAMUSCULAR | Status: DC | PRN
  Administered 2019-11-15: 4 mg via INTRAVENOUS

## 2019-11-15 MED ORDER — SODIUM CHLORIDE 0.9 % IV SOLN: INTRAVENOUS | Status: DC

## 2019-11-15 MED ORDER — MORPHINE SULFATE (PF) 4 MG/ML IV SOLN: 2.0000 mg | INTRAVENOUS | Status: DC | PRN

## 2019-11-15 MED ORDER — FENTANYL CITRATE (PF) 100 MCG/2ML IJ SOLN
INTRAMUSCULAR | Status: AC
  Filled 2019-11-15: qty 2

## 2019-11-15 MED ORDER — OXYBUTYNIN CHLORIDE 5 MG PO TABS: 5.0000 mg | ORAL_TABLET | Freq: Three times a day (TID) | ORAL | Status: DC | PRN

## 2019-11-15 MED ORDER — PROPOFOL 10 MG/ML IV BOLUS
INTRAVENOUS | Status: DC | PRN
  Administered 2019-11-15: 170 mg via INTRAVENOUS

## 2019-11-15 MED ORDER — ONDANSETRON HCL 4 MG/2ML IJ SOLN: 4.0000 mg | INTRAMUSCULAR | Status: DC | PRN

## 2019-11-15 MED ORDER — SODIUM CHLORIDE 0.9 % IR SOLN
Status: DC | PRN
  Administered 2019-11-15: 12000 mL via INTRAVESICAL

## 2019-11-15 MED ORDER — EPHEDRINE 5 MG/ML INJ
INTRAVENOUS | Status: AC
  Filled 2019-11-15: qty 10

## 2019-11-15 MED ORDER — FENTANYL CITRATE (PF) 100 MCG/2ML IJ SOLN
INTRAMUSCULAR | Status: DC | PRN
  Administered 2019-11-15 (×2): 25 ug via INTRAVENOUS
  Administered 2019-11-15: 50 ug via INTRAVENOUS
  Administered 2019-11-15: 75 ug via INTRAVENOUS
  Administered 2019-11-15: 25 ug via INTRAVENOUS
  Administered 2019-11-15: 50 ug via INTRAVENOUS

## 2019-11-15 MED ORDER — DIPHENHYDRAMINE HCL 12.5 MG/5ML PO ELIX: 12.5000 mg | ORAL_SOLUTION | Freq: Four times a day (QID) | ORAL | Status: DC | PRN

## 2019-11-15 MED ORDER — CEFAZOLIN SODIUM-DEXTROSE 2-4 GM/100ML-% IV SOLN: 2.0000 g | INTRAVENOUS | Status: DC

## 2019-11-15 MED ORDER — SODIUM CHLORIDE 0.9 % IR SOLN
3000.0000 mL | Status: DC
  Administered 2019-11-15 (×4): 3000 mL

## 2019-11-15 MED ORDER — ZOLPIDEM TARTRATE 5 MG PO TABS
5.0000 mg | ORAL_TABLET | Freq: Every evening | ORAL | Status: DC | PRN
  Filled 2019-11-15: qty 1

## 2019-11-15 MED ORDER — BELLADONNA ALKALOIDS-OPIUM 16.2-60 MG RE SUPP: 1.0000 | Freq: Four times a day (QID) | RECTAL | Status: DC | PRN

## 2019-11-15 MED ORDER — LIDOCAINE HCL (CARDIAC) PF 100 MG/5ML IV SOSY
PREFILLED_SYRINGE | INTRAVENOUS | Status: DC | PRN
  Administered 2019-11-15: 80 mg via INTRAVENOUS

## 2019-11-15 MED ORDER — OXYCODONE HCL 5 MG PO TABS: 5.0000 mg | ORAL_TABLET | ORAL | Status: DC | PRN

## 2019-11-15 MED ORDER — HYDROCODONE-ACETAMINOPHEN 5-325 MG PO TABS: 1.0000 | ORAL_TABLET | ORAL | 0 refills | Status: AC | PRN

## 2019-11-15 MED ORDER — BACITRACIN-NEOMYCIN-POLYMYXIN 400-5-5000 EX OINT: 1.0000 "application " | TOPICAL_OINTMENT | Freq: Three times a day (TID) | CUTANEOUS | Status: DC | PRN

## 2019-11-15 MED ORDER — PHENYLEPHRINE 40 MCG/ML (10ML) SYRINGE FOR IV PUSH (FOR BLOOD PRESSURE SUPPORT)
PREFILLED_SYRINGE | INTRAVENOUS | Status: AC
  Filled 2019-11-15: qty 20

## 2019-11-15 MED ORDER — CEFAZOLIN SODIUM-DEXTROSE 2-4 GM/100ML-% IV SOLN
2.0000 g | Freq: Three times a day (TID) | INTRAVENOUS | Status: AC
  Administered 2019-11-15 – 2019-11-16 (×2): 2 g via INTRAVENOUS
  Filled 2019-11-15 (×2): qty 100

## 2019-11-15 MED ORDER — ACETAMINOPHEN 325 MG PO TABS: 650.0000 mg | ORAL_TABLET | ORAL | Status: DC | PRN

## 2019-11-15 MED ORDER — ARTIFICIAL TEARS OPHTHALMIC OINT
TOPICAL_OINTMENT | OPHTHALMIC | Status: AC
  Filled 2019-11-15: qty 3.5

## 2019-11-15 MED ORDER — EPHEDRINE SULFATE 50 MG/ML IJ SOLN
INTRAMUSCULAR | Status: DC | PRN
  Administered 2019-11-15: 10 mg via INTRAVENOUS
  Administered 2019-11-15: 5 mg via INTRAVENOUS
  Administered 2019-11-15: 10 mg via INTRAVENOUS
  Administered 2019-11-15: 5 mg via INTRAVENOUS

## 2019-11-15 MED ORDER — DEXAMETHASONE SODIUM PHOSPHATE 4 MG/ML IJ SOLN
INTRAMUSCULAR | Status: DC | PRN
  Administered 2019-11-15: 10 mg via INTRAVENOUS

## 2019-11-15 MED ORDER — LACTATED RINGERS IV SOLN: INTRAVENOUS | Status: DC

## 2019-11-15 MED ORDER — CEFAZOLIN SODIUM-DEXTROSE 2-4 GM/100ML-% IV SOLN
2.0000 g | INTRAVENOUS | Status: AC
  Administered 2019-11-15: 2 g via INTRAVENOUS
  Filled 2019-11-15: qty 100

## 2019-11-15 MED ORDER — VITAMIN D 25 MCG (1000 UNIT) PO TABS
2000.0000 [IU] | ORAL_TABLET | Freq: Every day | ORAL | Status: DC
  Filled 2019-11-15: qty 2

## 2019-11-15 MED ORDER — DIPHENHYDRAMINE HCL 50 MG/ML IJ SOLN: 12.5000 mg | Freq: Four times a day (QID) | INTRAMUSCULAR | Status: DC | PRN

## 2019-11-15 MED ORDER — SENNOSIDES-DOCUSATE SODIUM 8.6-50 MG PO TABS
2.0000 | ORAL_TABLET | Freq: Every day | ORAL | Status: DC
  Filled 2019-11-15: qty 2

## 2019-11-15 SURGICAL SUPPLY — 17 items
BAG URINE DRAIN 2000ML AR STRL (UROLOGICAL SUPPLIES) ×3 IMPLANT
BAG URO CATCHER STRL LF (MISCELLANEOUS) ×3 IMPLANT
CATH FOLEY 3WAY 30CC 24FR (CATHETERS) ×2
CATH URTH STD 24FR FL 3W 2 (CATHETERS) ×1 IMPLANT
GLOVE BIO SURGEON STRL SZ7.5 (GLOVE) ×3 IMPLANT
GOWN STRL REUS W/TWL XL LVL3 (GOWN DISPOSABLE) ×3 IMPLANT
HOLDER FOLEY CATH W/STRAP (MISCELLANEOUS) ×3 IMPLANT
KIT TURNOVER KIT A (KITS) ×2 IMPLANT
LOOP CUT BIPOLAR 24F LRG (ELECTROSURGICAL) ×3 IMPLANT
MANIFOLD NEPTUNE II (INSTRUMENTS) ×3 IMPLANT
PACK CYSTO (CUSTOM PROCEDURE TRAY) ×3 IMPLANT
SYR 30ML LL (SYRINGE) ×2 IMPLANT
SYR TOOMEY IRRIG 70ML (MISCELLANEOUS) ×3
SYRINGE TOOMEY IRRIG 70ML (MISCELLANEOUS) ×1 IMPLANT
TUBING CONNECTING 10 (TUBING) ×2 IMPLANT
TUBING CONNECTING 10' (TUBING) ×1
TUBING UROLOGY SET (TUBING) ×3 IMPLANT

## 2019-11-15 NOTE — Anesthesia Preprocedure Evaluation (Addendum)
Anesthesia Evaluation  Patient identified by MRN, date of birth, ID band Patient awake    Reviewed: Allergy & Precautions, NPO status , Patient's Chart, lab work & pertinent test results  Airway Mallampati: II  TM Distance: >3 FB Neck ROM: Full    Dental  (+) Partial Lower, Partial Upper, Dental Advisory Given   Pulmonary pneumonia, COPD, former smoker,     + decreased breath sounds+ wheezing      Cardiovascular negative cardio ROS Normal cardiovascular exam Rhythm:Regular Rate:Normal     Neuro/Psych negative neurological ROS  negative psych ROS   GI/Hepatic negative GI ROS, Neg liver ROS,   Endo/Other  negative endocrine ROS  Renal/GU negative Renal ROS     Musculoskeletal  (+) Arthritis ,   Abdominal (+) + obese,   Peds  Hematology negative hematology ROS (+)   Anesthesia Other Findings   Reproductive/Obstetrics negative OB ROS                            Anesthesia Physical Anesthesia Plan  ASA: III  Anesthesia Plan: General   Post-op Pain Management:    Induction: Intravenous  PONV Risk Score and Plan: 4 or greater and Ondansetron, Dexamethasone and Treatment may vary due to age or medical condition  Airway Management Planned: LMA  Additional Equipment: None  Intra-op Plan:   Post-operative Plan: Extubation in OR  Informed Consent: I have reviewed the patients History and Physical, chart, labs and discussed the procedure including the risks, benefits and alternatives for the proposed anesthesia with the patient or authorized representative who has indicated his/her understanding and acceptance.     Dental advisory given  Plan Discussed with: CRNA  Anesthesia Plan Comments:         Anesthesia Quick Evaluation

## 2019-11-15 NOTE — Discharge Instructions (Signed)

## 2019-11-15 NOTE — Op Note (Signed)
Preoperative diagnosis: 1. Bladder outlet obstruction secondary to BPH with urinary retention  Postoperative diagnosis:  1. Bladder outlet obstruction secondary to BPH with urinary retention  Procedure:  1. Cystoscopy 2. Transurethral resection of the prostate  Surgeon: Ray Church, III. M.D.  Anesthesia: General  Complications: None  EBL: Minimal  Specimens: 1. Prostate chips  Indication: Jeffrey [name] is a patient with bladder outlet obstruction secondary to benign prostatic hyperplasia. After reviewing the management options for treatment, he elected to proceed with the above surgical procedure(s). We have discussed the potential benefits and risks of the procedure, side effects of the proposed treatment, the likelihood of the patient achieving the goals of the procedure, and any potential problems that might occur during the procedure or recuperation. Informed consent has been obtained.  Description of procedure:  The patient was taken to the operating room and general anesthesia was induced.  The patient was placed in the dorsal lithotomy position, prepped and draped in the usual sterile fashion, and preoperative antibiotics were administered. A preoperative time-out was performed.   Cystourethroscopy was performed.  The patient's urethra was examined and was normal/ demonstrated bilobar prostatic hypertrophy with a median lobe.   The bladder was then systematically examined in its entirety. There was no evidence of any bladder tumors, stones, or other mucosal pathology.  The ureteral orifices were identified and marked so as to be avoided during the procedure.  The prostate adenoma was then resected utilizing loop cautery resection with the bipolar cutting loop.  The prostate adenoma from the bladder neck back to the verumontanum was resected beginning at the six o'clock position and then extended to include the right and left lobes of the prostate and anterior prostate.  Care was taken not to resect distal to the verumontanum.  Hemostasis was then achieved with the cautery and the bladder was emptied and reinspected with no significant bleeding noted at the end of the procedure.    A 24 French 3 way catheter was then placed into the bladder.  The patient appeared to tolerate the procedure well and without complications.  The patient was able to be awakened and transferred to the recovery unit in satisfactory condition.

## 2019-11-15 NOTE — Anesthesia Procedure Notes (Signed)
Procedure Name: LMA Insertion Date/Time: 11/15/2019 9:53 AM Performed by: Theodosia Quay, CRNA Pre-anesthesia Checklist: Patient identified, Emergency Drugs available, Suction available and Patient being monitored Patient Re-evaluated:Patient Re-evaluated prior to induction Oxygen Delivery Method: Circle System Utilized Preoxygenation: Pre-oxygenation with 100% oxygen Induction Type: IV induction Ventilation: Mask ventilation without difficulty LMA: LMA with gastric port inserted LMA Size: 4.0 Number of attempts: 1 Airway Equipment and Method: Bite block Placement Confirmation: positive ETCO2 Tube secured with: Tape Dental Injury: Teeth and Oropharynx as per pre-operative assessment

## 2019-11-15 NOTE — Transfer of Care (Signed)
Immediate Anesthesia Transfer of Care Note  Patient: Jeffrey [name]  Procedure(s) Performed: Procedure(s): TRANSURETHRAL RESECTION OF THE PROSTATE (TURP) (N/A)  Patient Location: PACU  Anesthesia Type:General  Level of Consciousness:  sedated, patient cooperative and responds to stimulation  Airway & Oxygen Therapy:Patient Spontanous Breathing and Patient connected to face mask oxgen  Post-op Assessment:  Report given to PACU RN and Post -op Vital signs reviewed and stable  Post vital signs:  Reviewed and stable  Last Vitals:  Vitals:   11/15/19 0818 11/15/19 1101  BP: 124/86 129/89  Pulse: 90 94  Resp: 17 11  Temp: 36.9 C   SpO2: 94% 100%    Complications: No apparent anesthesia complications

## 2019-11-15 NOTE — H&P (Signed)
CC/HPI: CC: Urinary retention  HPI:  11/10/2019  79 year old male presents with urinary retention. He had a catheter placed about a week ago. He has had several episodes of urinary retention since last June. He has required a catheter intermittently. In between catheters, he has weak stream, frequency, difficulty postponing urination, nocturia, postvoid dribbling, intermittency. He did have a PSA this year during an episode of UTI. PSA was 14.4. Prior to this, PSA has always been less than 4. We talked about this in relation to his age of 57 and decided to hold off on a prostate biopsy and instead just observe for now and take care of his retention.     ALLERGIES: No Known Drug Allergies    MEDICATIONS: Aspirin  Tamsulosin Hcl 0.4 mg capsule  Red Yeast Rice     GU PSH: None   NON-GU PSH: None   GU PMH: None   NON-GU PMH: None   FAMILY HISTORY: 3 Son's - Other Congestive Heart Failure - Mother   SOCIAL HISTORY: Marital Status: Widowed Preferred Language: English; Race: White Current Smoking Status: Patient does not smoke anymore. Has not smoked since 10/30/1999.   Tobacco Use Assessment Completed: Used Tobacco in last 30 days? Drinks 1 caffeinated drink per day.    REVIEW OF SYSTEMS:    GU Review Male:   Patient reports frequent urination, hard to postpone urination, burning/ pain with urination, get up at night to urinate, leakage of urine, and stream starts and stops. Patient denies trouble starting your stream, have to strain to urinate , erection problems, and penile pain.  Gastrointestinal (Upper):   Patient denies nausea, indigestion/ heartburn, and vomiting.  Gastrointestinal (Lower):   Patient reports diarrhea. Patient denies constipation.  Constitutional:   Patient reports fever, weight loss, and fatigue. Patient denies night sweats.  Skin:   Patient reports itching. Patient denies skin rash/ lesion.  Eyes:   Patient denies blurred vision and double vision.  Ears/ Nose/  Throat:   Patient denies sore throat and sinus problems.  Hematologic/Lymphatic:   Patient reports easy bruising. Patient denies swollen glands.  Cardiovascular:   Patient denies leg swelling and chest pains.  Respiratory:   Patient reports cough and shortness of breath.   Endocrine:   Patient denies excessive thirst.  Musculoskeletal:   Patient denies back pain and joint pain.  Neurological:   Patient denies headaches and dizziness.  Psychologic:   Patient denies depression and anxiety.   Notes: hematuria weak stream     VITAL SIGNS:      11/10/2019 08:14 AM  Weight 230 lb / 104.33 kg  Height 68 in / 172.72 cm  BP 127/77 mmHg  Heart Rate 97 /min  Temperature 98.4 F / 36.8 C  BMI 35.0 kg/m   GU PHYSICAL EXAMINATION:    Penis: Penis uncircumcised. No foreskin warts, no cracks. No dorsal peyronie's plaques, no left corporal peyronie's plaques, no right corporal peyronie's plaques, no scarring, no shaft warts. No balanitis, no meatal stenosis.   Prostate: Prostate about 50 grams. Left lobe normal consistency, right lobe normal consistency. Symmetrical lobes. No prostate nodule. Left lobe no tenderness, right lobe no tenderness.    MULTI-SYSTEM PHYSICAL EXAMINATION:    Constitutional: Well-nourished. No physical deformities. Normally developed. Good grooming.  Respiratory: No labored breathing, no use of accessory muscles.   Cardiovascular: Normal temperature, adequate perfusion of extremities  Skin: No paleness, no jaundice  Neurologic / Psychiatric: Oriented to time, oriented to place, oriented to person. No depression, no  anxiety, no agitation.  Gastrointestinal: No mass, no tenderness, no rigidity, non obese abdomen.  Eyes: Normal conjunctivae. Normal eyelids.  Musculoskeletal: Normal gait and station of head and neck.     PAST DATA REVIEWED:  Source Of History:  Patient  Lab Test Review:   PSA  Records Review:   Previous Doctor Records, Previous Patient Records    PROCEDURES:         Flexible Cystoscopy - 52000  Risks, benefits, and some of the potential complications of the procedure were discussed at length with the patient including infection, bleeding, voiding discomfort, urinary retention, fever, chills, sepsis, and others. All questions were answered. Informed consent was obtained. Antibiotic prophylaxis was given. Sterile technique and intraurethral analgesia were used.  Meatus:  Normal size. Normal location. Normal condition.  Urethra:  No strictures.  External Sphincter:  Normal.  Verumontanum:  Normal.  Prostate:  Obstructing. Enlarged median lobe. Severe hyperplasia. About 4 cm prostate  Bladder Neck:  Non-obstructing.  Ureteral Orifices:  Normal location. Normal size. Normal shape.  Bladder:  No trabeculation. No tumors. Normal mucosa except for some catheter edema. No stones.      The lower urinary tract was carefully examined. The procedure was well-tolerated and without complications. Antibiotic instructions were given. Instructions were given to call the office immediately for bloody urine, difficulty urinating, urinary retention, painful or frequent urination, fever, chills, nausea, vomiting or other illness. The patient stated that he understood these instructions and would comply with them.        Simple Foley Catheterization - [id number]  A 16 French coude Foley catheter was inserted into the bladder using sterile technique. The patient was taught routine catheter care. A leg bag was connected.   ASSESSMENT:      ICD-10 Details  1 GU:   BPH w/LUTS - N40.1 Undiagnosed New Problem  2   Urinary Retention - R33.8 Undiagnosed New Problem  3   Encounter for Prostate Cancer screening - Z12.5 Undiagnosed New Problem   PLAN:           Document Letter(s):  Created for Patient: Clinical Summary         Notes:   The patient has failed medical management for his lower urinary tract symptoms. He would like to proceed with surgical resection.  I discussed bipolar transurethral resection of the prostate. I specifically discussed the risks including but not limited to bleeding which could require blood transfusion, infection, and injury to surrounding structures. Also discussed the possibility that the surgery would not improve symptoms though most men have a great improvement in their symptoms. Also discussed the low likelihood but possibility of development of new symptoms such as irritative voiding symptoms or urinary incontinence. Most men will have some degree of urinary urgency and discomfort immediately following the surgery that resolves in a short amount of time. He understands that most often this is an outpatient procedure but occasionally patients require hospitalization for continuous bladder irrigation in the event of excess bleeding. He also understands the possibility of being sent home with a urethral catheter. The patient expressed understanding and is eager to proceed.   We will also follow his PSA in the future. I think it is more prudent to get him out of retention rather than follow PSA at this time given his age of 37.     Signed by Link Snuffer, III, M.D. on 11/10/19 at 11:29 AM (EST

## 2019-11-16 ENCOUNTER — Encounter: Payer: Self-pay | Admitting: *Deleted

## 2019-11-16 DIAGNOSIS — N401 Enlarged prostate with lower urinary tract symptoms: Secondary | ICD-10-CM | POA: Diagnosis not present

## 2019-11-16 LAB — HEMOGLOBIN AND HEMATOCRIT, BLOOD
HCT: 40 % (ref 39.0–52.0)
Hemoglobin: 12.7 g/dL — ABNORMAL LOW (ref 13.0–17.0)

## 2019-11-16 LAB — SURGICAL PATHOLOGY

## 2019-11-16 MED ORDER — CHLORHEXIDINE GLUCONATE CLOTH 2 % EX PADS
6.0000 | MEDICATED_PAD | Freq: Every day | CUTANEOUS | Status: DC
  Administered 2019-11-16: 6 via TOPICAL

## 2019-11-16 NOTE — Progress Notes (Signed)
Went over foley and leg bag teaching with patient and son with Tommy Medal, RN.  All questions answered.  AVS given to patient.

## 2019-11-16 NOTE — Anesthesia Postprocedure Evaluation (Signed)
Anesthesia Post Note  Patient: Jeffrey [name]  Procedure(s) Performed: TRANSURETHRAL RESECTION OF THE PROSTATE (TURP) (N/A )     Patient location during evaluation: PACU Anesthesia Type: General Level of consciousness: sedated and patient cooperative Pain management: pain level controlled Vital Signs Assessment: post-procedure vital signs reviewed and stable Respiratory status: spontaneous breathing Cardiovascular status: stable Anesthetic complications: no    Last Vitals:  Vitals:   11/16/19 0116 11/16/19 0421  BP: 125/74 119/72  Pulse: 77 75  Resp: 18 16  Temp: (!) 36.4 C (!) 36.4 C  SpO2: 93% 93%    Last Pain:  Vitals:   11/16/19 0421  TempSrc: Oral  PainSc:                  [name]

## 2019-11-25 NOTE — Discharge Summary (Signed)
Physician Discharge Summary  Patient ID: Jeffrey [name] MRN: [id number] DOB/AGE: 77-26-1942 77 y.o.  Admit date: 11/15/2019 Discharge date: 11/16/2019  Admission Diagnoses:  Discharge Diagnoses:  Active Problems:   Urinary retention   Discharged Condition: good  Hospital Course: Patient underwent a TURP on 11/15/2019.  He was weaned off CBI overnight.  The next day his urine was clear.  He was discharged home with his catheter  Consults: None  Significant Diagnostic Studies: None  Treatments: surgery: As above  Discharge Exam: Blood pressure 119/72, pulse 75, temperature (!) 97.5 F (36.4 C), temperature source Oral, resp. rate 16, height 5\' 9"  (1.753 m), weight 107 kg, SpO2 93 %. General appearance: alert no acute distress Adequate perfusion of extremities Nonlabored respiration Abdomen soft, nontender, nondistended Foley catheter draining clear yellow urine  Disposition: Discharge disposition: 01-Home or Self Care       Discharge Instructions    Diet - low sodium heart healthy   Complete by: As directed      Allergies as of 11/16/2019   No Known Allergies     Medication List    TAKE these medications   aspirin 81 MG tablet Take 81 mg by mouth daily.   ciprofloxacin 500 MG tablet Commonly known as: Cipro Take 1 tablet (500 mg total) by mouth 2 (two) times daily. One po bid x 7 days   HYDROcodone-acetaminophen 5-325 MG tablet Commonly known as: Norco Take 1 tablet by mouth every 4 (four) hours as needed for moderate pain.   Red Yeast Rice 600 MG Caps Take 600 mg by mouth daily.   tamsulosin 0.4 MG Caps capsule Commonly known as: FLOMAX Take 0.8 mg by mouth daily after supper.   Vitamin D 50 MCG (2000 UT) Caps Take 2,000 Units by mouth daily.        Signed: 11/18/2019, III 11/25/2019, 2:45 PM

## 2019-12-28 DIAGNOSIS — N401 Enlarged prostate with lower urinary tract symptoms: Secondary | ICD-10-CM | POA: Diagnosis not present

## 2019-12-28 DIAGNOSIS — R338 Other retention of urine: Secondary | ICD-10-CM | POA: Diagnosis not present

## 2021-09-08 ENCOUNTER — Encounter (HOSPITAL_BASED_OUTPATIENT_CLINIC_OR_DEPARTMENT_OTHER): Payer: Self-pay | Admitting: Emergency Medicine

## 2021-09-08 ENCOUNTER — Other Ambulatory Visit: Payer: Self-pay

## 2021-09-08 ENCOUNTER — Emergency Department (HOSPITAL_BASED_OUTPATIENT_CLINIC_OR_DEPARTMENT_OTHER)
Admission: EM | Admit: 2021-09-08 | Discharge: 2021-09-08 | Disposition: A | Discharge status: Home / Self Care | Payer: No Typology Code available for payment source | Attending: Emergency Medicine | Admitting: Emergency Medicine

## 2021-09-08 ENCOUNTER — Emergency Department (HOSPITAL_BASED_OUTPATIENT_CLINIC_OR_DEPARTMENT_OTHER): Payer: No Typology Code available for payment source

## 2021-09-08 DIAGNOSIS — Y9289 Other specified places as the place of occurrence of the external cause: Secondary | ICD-10-CM | POA: Insufficient documentation

## 2021-09-08 DIAGNOSIS — S0181XA Laceration without foreign body of other part of head, initial encounter: Secondary | ICD-10-CM

## 2021-09-08 DIAGNOSIS — S0990XA Unspecified injury of head, initial encounter: Secondary | ICD-10-CM

## 2021-09-08 DIAGNOSIS — Z7982 Long term (current) use of aspirin: Secondary | ICD-10-CM | POA: Diagnosis not present

## 2021-09-08 DIAGNOSIS — W01198A Fall on same level from slipping, tripping and stumbling with subsequent striking against other object, initial encounter: Secondary | ICD-10-CM | POA: Diagnosis not present

## 2021-09-08 DIAGNOSIS — J449 Chronic obstructive pulmonary disease, unspecified: Secondary | ICD-10-CM | POA: Insufficient documentation

## 2021-09-08 DIAGNOSIS — Y9301 Activity, walking, marching and hiking: Secondary | ICD-10-CM | POA: Insufficient documentation

## 2021-09-08 MED ORDER — LIDOCAINE-EPINEPHRINE (PF) 2 %-1:200000 IJ SOLN
10.0000 mL | Freq: Once | INTRAMUSCULAR | Status: AC
  Administered 2021-09-08: 10 mL
  Filled 2021-09-08: qty 20

## 2021-09-08 NOTE — ED Notes (Signed)
Patient transported to CT 

## 2021-09-08 NOTE — ED Notes (Signed)
MD at bedside for laceration repair.

## 2021-09-08 NOTE — ED Notes (Signed)
Wound care performed on sutured head laceration and right palm abrasion. Instructions on home care reviewed and all questions were answered at time of dc. Wound care supplies provided to patient. Pt ambulatory to ED exit.

## 2021-09-08 NOTE — ED Triage Notes (Signed)
Pt states walking outside and tripped on a rock and hit head on a concrete column. Laceration noted on forehead that pulsates blood.

## 2021-09-08 NOTE — Discharge Instructions (Signed)
It is okay for you to shower and this stitches can get wet just do not scrub on them.  They will do some today so will be a good idea to keep them covered.  Otherwise you can just put a little Vaseline on it and leave it open to the air.  They need to be removed and 5 to 7 days.  You can return here but your doctor's office should also be able to do it.

## 2021-09-08 NOTE — ED Provider Notes (Signed)
[facility] EMERGENCY DEPARTMENT Provider Note   CSN: [id number] Arrival date & time: 09/08/21  1953     History  Chief Complaint  Patient presents with   Laceration    Jeffrey [name] is a 81 y.o. male.  Patient is a 81 year old male with history of COPD, arthritis and a prostate disorder who is presenting today after a fall.  He was at the [facility] for a blood drive helping with the event today and as he was leaving he tripped over a rock and fell forward and hit his head on a hard surface.  It started bleeding immediately and he had a cut.  However he denies having any loss of consciousness.  He has not felt nauseated, dizzy or had a headache.  He denies any neck pain today.  He has no difficulty walking and do not want numbness or weakness on one side of his body.  He does take a 81 mg aspirin daily but no other anticoagulation.  His tetanus shot is up-to-date.  The history is provided by the patient.  Laceration     Home Medications Prior to Admission medications   Medication Sig Start Date End Date Taking? Authorizing Provider  aspirin 81 MG tablet Take 81 mg by mouth daily.    [provider]  Cholecalciferol (VITAMIN D) 50 MCG (2000 UT) CAPS Take 2,000 Units by mouth daily.    [provider]  ciprofloxacin (CIPRO) 500 MG tablet Take 1 tablet (500 mg total) by mouth 2 (two) times daily. One po bid x 7 days Patient not taking: Reported on 11/13/2019 10/14/19   Molpus, John, MD  HYDROcodone-acetaminophen (NORCO) 5-325 MG tablet Take 1 tablet by mouth every 4 (four) hours as needed for moderate pain. 11/15/19   Crista Elliot, MD  Red Yeast Rice 600 MG CAPS Take 600 mg by mouth daily.     [provider]  tamsulosin (FLOMAX) 0.4 MG CAPS capsule Take 0.8 mg by mouth daily after supper.     [provider]      Allergies    Patient has no known allergies.    Review of Systems   Review of Systems  Physical Exam Updated Vital  Signs BP (!) 153/100    Pulse 69    Temp 98.1 F (36.7 C) (Oral)    Resp 15    Ht 5\' 10"  (1.778 m)    Wt 113.4 kg    SpO2 98%    BMI 35.87 kg/m  Physical Exam Vitals and nursing note reviewed.  Constitutional:      General: He is not in acute distress.    Appearance: He is well-developed.  HENT:     Head: Normocephalic and atraumatic.      Comments: 3 cm laceration to the forehead with surrounding hematoma.  Mild active bleeding Eyes:     Conjunctiva/sclera: Conjunctivae normal.     Pupils: Pupils are equal, round, and reactive to light.  Cardiovascular:     Rate and Rhythm: Normal rate and regular rhythm.     Heart sounds: No murmur heard. Pulmonary:     Effort: Pulmonary effort is normal. No respiratory distress.     Breath sounds: Normal breath sounds. No wheezing or rales.  Abdominal:     Palpations: Abdomen is soft.  Musculoskeletal:        General: No tenderness. Normal range of motion.     Cervical back: Normal range of motion and neck supple. No  tenderness. No spinous process tenderness or muscular tenderness.     Right lower leg: No edema.     Left lower leg: No edema.  Skin:    General: Skin is warm and dry.     Findings: No erythema or rash.  Neurological:     Mental Status: He is alert and oriented to person, place, and time. Mental status is at baseline.  Psychiatric:        Mood and Affect: Mood normal.        Behavior: Behavior normal.    ED Results / Procedures / Treatments   Labs (all labs ordered are listed, but only abnormal results are displayed) Labs Reviewed - No data to display  EKG None  Radiology CT Head Wo Contrast  Result Date: 09/08/2021 CLINICAL DATA:  Head trauma, minor (Age >= 65y).  Status post fall EXAM: CT HEAD WITHOUT CONTRAST TECHNIQUE: Contiguous axial images were obtained from the base of the skull through the vertex without intravenous contrast. COMPARISON:  None. BRAIN: BRAIN Cerebral ventricle sizes are concordant with the  degree of cerebral volume loss. Mild patchy and confluent areas of decreased attenuation are noted throughout the deep and periventricular white matter of the cerebral hemispheres bilaterally, compatible with chronic microvascular ischemic disease. No evidence of large-territorial acute infarction. No parenchymal hemorrhage. No mass lesion. No extra-axial collection. No mass effect or midline shift. No hydrocephalus. Basilar cisterns are patent. Vascular: No hyperdense vessel. Skull: No acute fracture or focal lesion. Sinuses/Orbits: Left maxillary sinus mucosal thickening. Paranasal sinuses and mastoid air cells are clear. Bilateral lens replacement. Otherwise the orbits are unremarkable. Other: No large scalp hematoma formation. IMPRESSION: No acute intracranial abnormality. Electronically Signed   By: Tish FredericksonMorgane  Naveau M.D.   On: 09/08/2021 21:34    Procedures Procedures  LACERATION REPAIR Performed by: [employer] Keiri Solano Authorized by: Gwyneth SproutWhitney Kervin Bones Consent: Verbal consent obtained. Risks and benefits: risks, benefits and alternatives were discussed Consent given by: patient Patient identity confirmed: provided demographic data Prepped and Draped in normal sterile fashion Wound explored  Laceration Location: forehead  Laceration Length: 3cm  No Foreign Bodies seen or palpated  Anesthesia: local infiltration  Local anesthetic: lidocaine 1% with epinephrine  Anesthetic total: 5 ml  Irrigation method: syringe Amount of cleaning: standard  Skin closure: 6.0 prolene  Number of sutures: 9  Technique: simple interrupted  Patient tolerance: Patient tolerated the procedure well with no immediate complications.    Medications Ordered in ED Medications  lidocaine-EPINEPHrine (XYLOCAINE W/EPI) 2 %-1:200000 (PF) injection 10 mL (10 mLs Infiltration Given by Other 09/08/21 2216)    ED Course/ Medical Decision Making/ A&P                           Medical Decision Making  Patient  is a 81 year old male presenting today after a fall and injury to his forehead.  He has a laceration to his forehead that is mildly bleeding at this time.  He does not take any anticoagulation other than aspirin.  His tetanus shot is up-to-date.  On exam patient is neurovascularly intact at this time.  He is in no acute distress.  Wound repaired as above.  We will do imaging of the patient's head given his age location of the injury and that he does take aspirin.  He has no neck pain today and low suspicion for cervical injury.  10:34 PM I have personally evaluated patient's head CT and do not see any  acute findings.  Radiology read is benign.  Patient's wound was repaired and he was discharged home.  Findings discussed with the patient and his family member.  All their questions were answered and he appears stable.        Final Clinical Impression(s) / ED Diagnoses Final diagnoses:  Facial laceration, initial encounter  Injury of head, initial encounter    Rx / DC Orders ED Discharge Orders     None         Gwyneth Sprout, MD 09/08/21 2234

## 2023-04-06 IMAGING — CT CT HEAD W/O CM
3 series · 14 of 47 positions shown, 16 images · non-contrast
Comparison: None.

CLINICAL DATA: Head trauma, minor (Age >= 65y).  Status post fall

EXAM:
CT HEAD WITHOUT CONTRAST
TECHNIQUE: Contiguous axial images were obtained from the base of the skull
through the vertex without intravenous contrast.

[Series 2: head wo · axial · 0.53mm/px · z∈[+1350,+1486]mm · 8 of 33 slices shown, 10 images]
[im 3/33  brain]
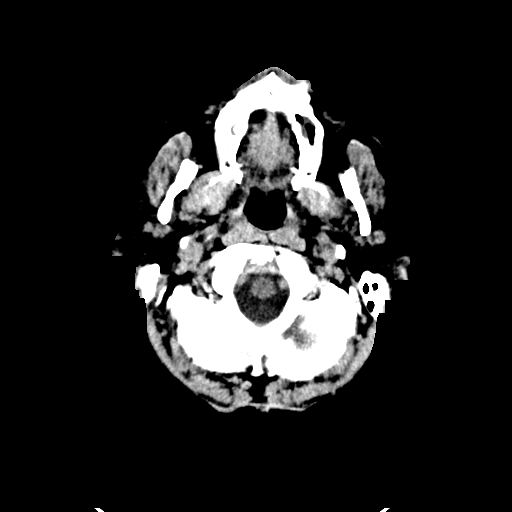
[im 3/33  bone]
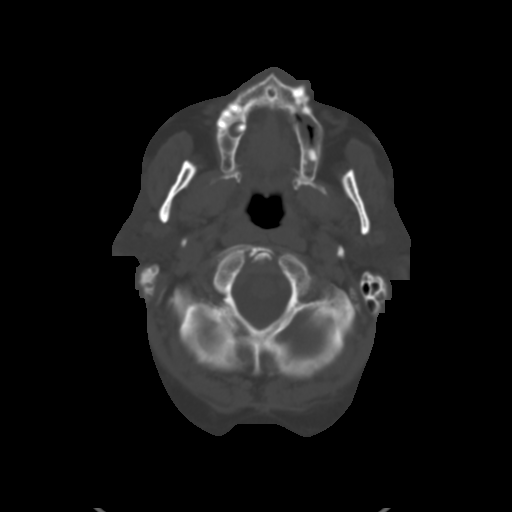
[im 7/33  brain]
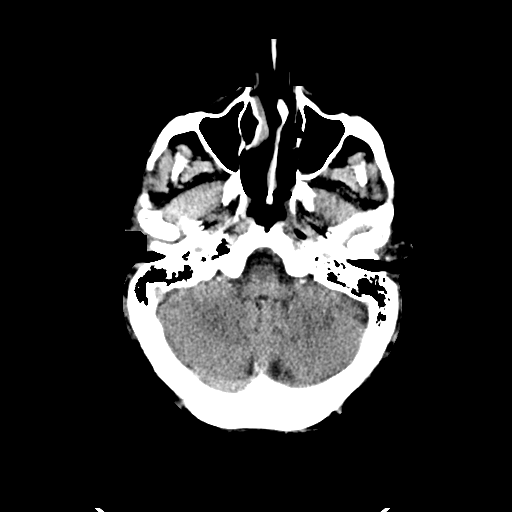
[im 10/33  brain]
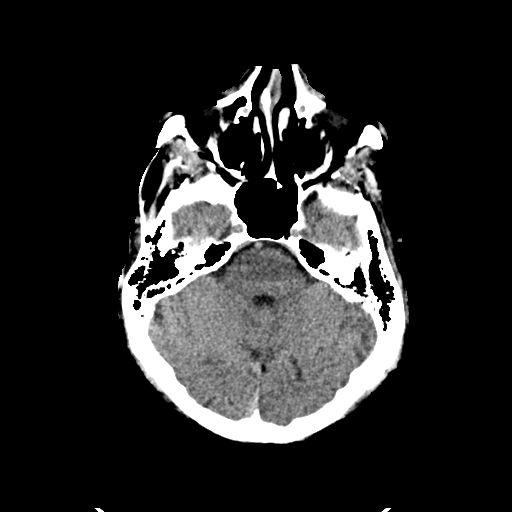
[im 15/33  brain]
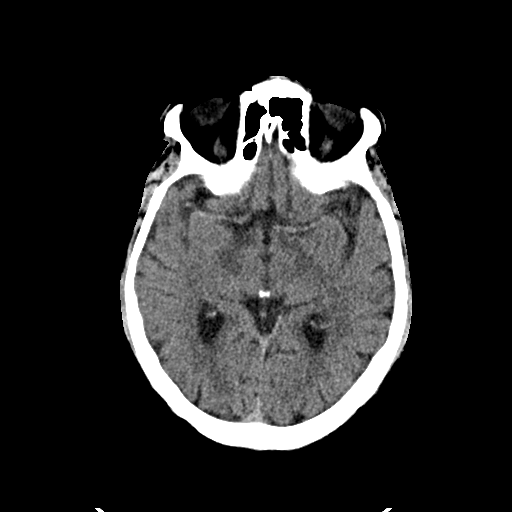
[im 18/33  brain]
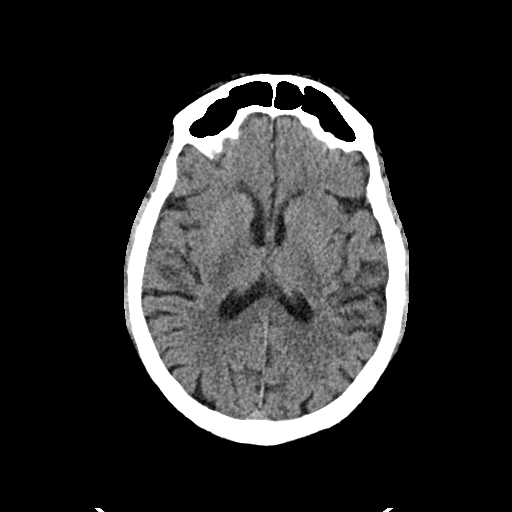
[im 18/33  bone]
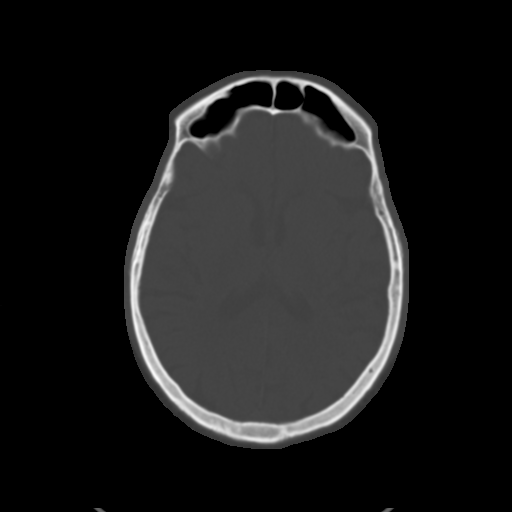
[im 23/33  brain]
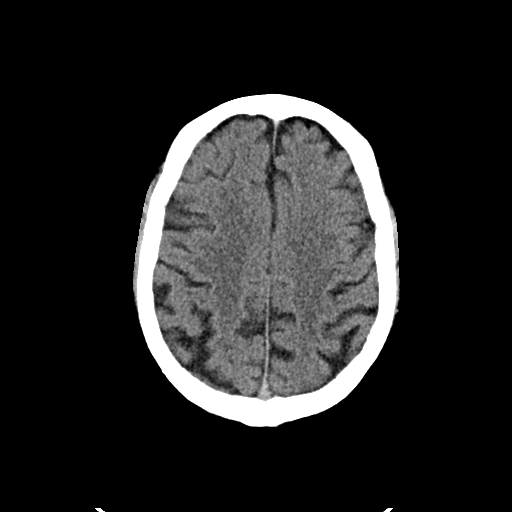
[im 26/33  brain]
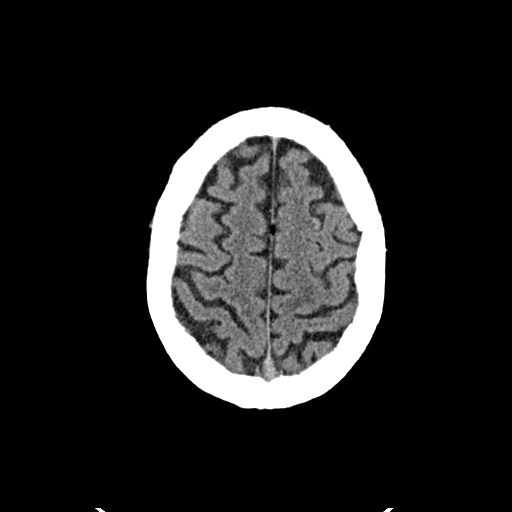
[im 30/33  brain]
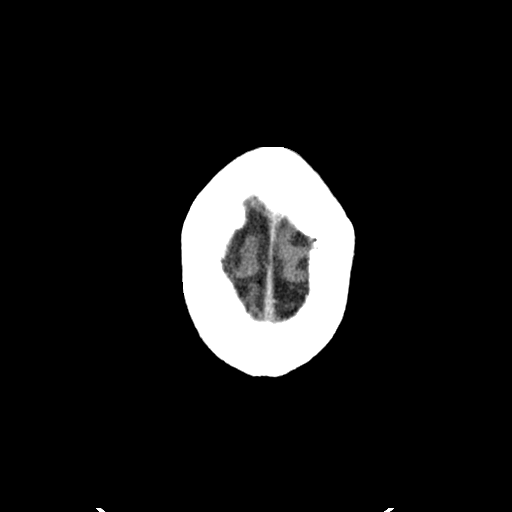

[Series 4: coronal soft · coronal · 0.32mm/px · 3 of 73 slices shown]
[im 25/73  brain]
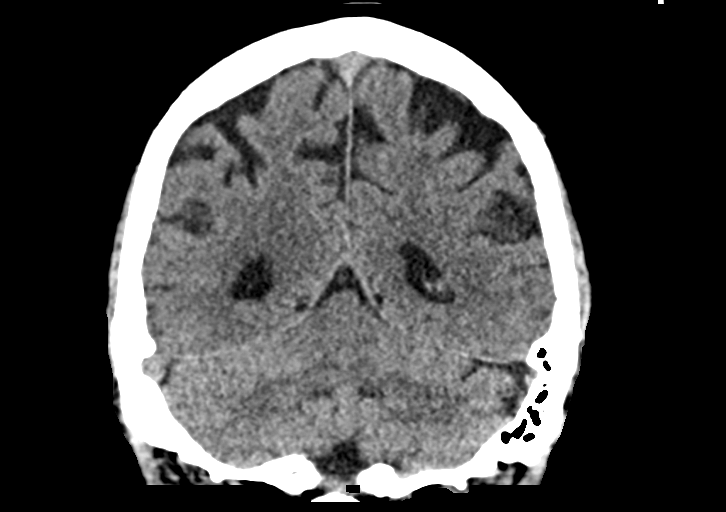
[im 33/73  brain]
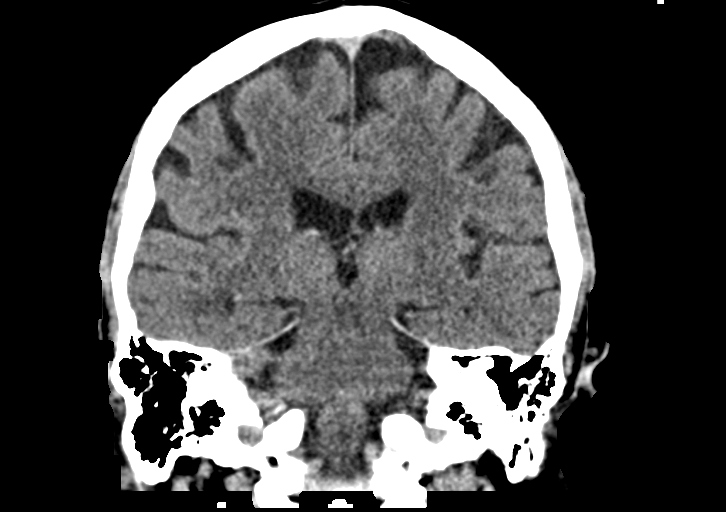
[im 41/73  brain]
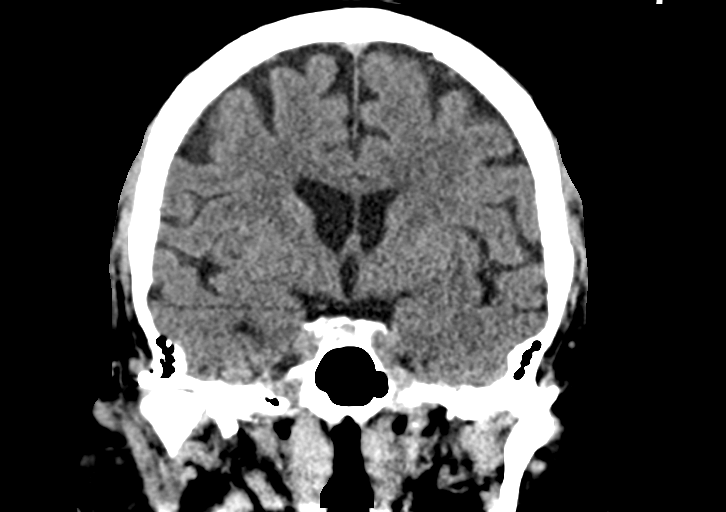

[Series 5: sag soft · sagittal · 0.32mm/px · 3 of 67 slices shown]
[im 23/67  brain]
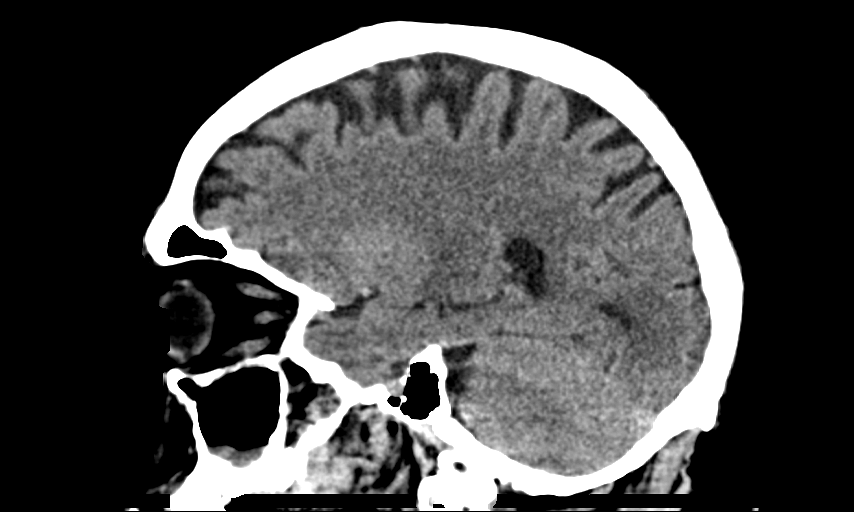
[im 34/67  brain]
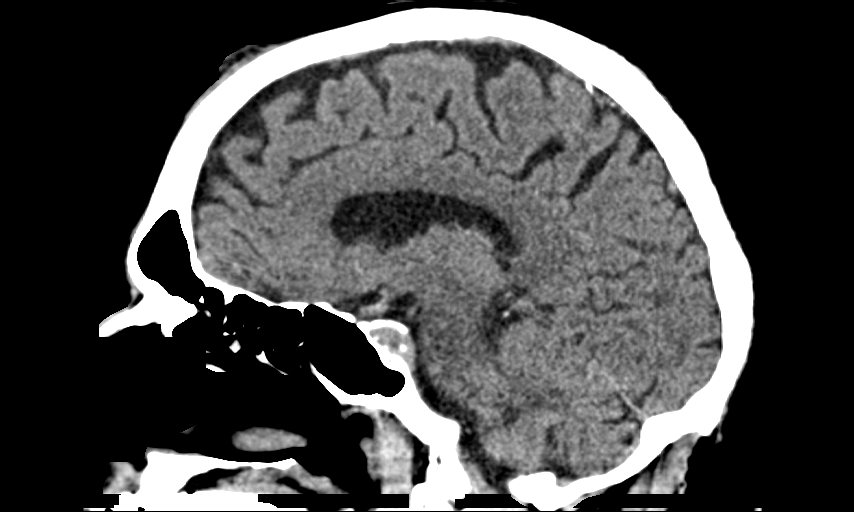
[im 45/67  brain]
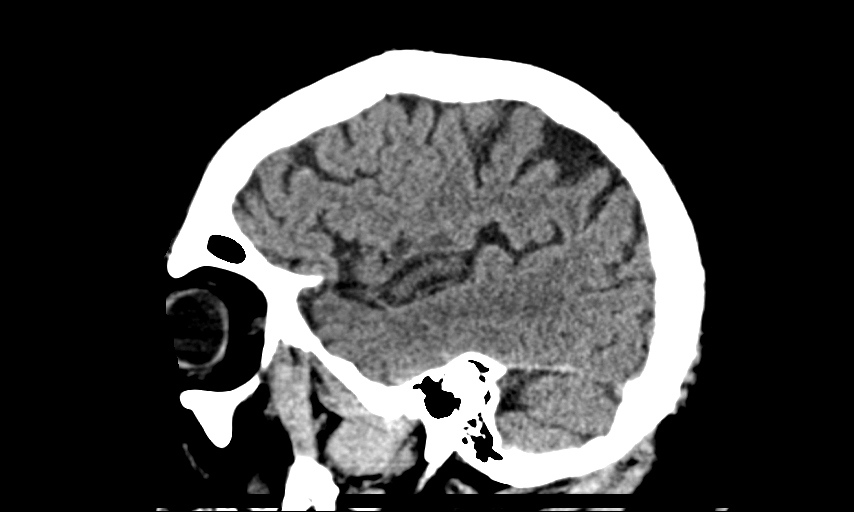

[14 of 47 positions shown; findings below may reference images not displayed]

BRAIN:
BRAIN
Cerebral ventricle sizes are concordant with the degree of cerebral
volume loss. Mild patchy and confluent areas of decreased
attenuation are noted throughout the deep and periventricular white
matter of the cerebral hemispheres bilaterally, compatible with
chronic microvascular ischemic disease.

No evidence of large-territorial acute infarction. No parenchymal
hemorrhage. No mass lesion. No extra-axial collection.

No mass effect or midline shift. No hydrocephalus. Basilar cisterns
are patent.

Vascular: No hyperdense vessel.

Skull: No acute fracture or focal lesion.

Sinuses/Orbits: Left maxillary sinus mucosal thickening. Paranasal
sinuses and mastoid air cells are clear. Bilateral lens replacement.
Otherwise the orbits are unremarkable.

Other: No large scalp hematoma formation.
IMPRESSION: No acute intracranial abnormality.
